# Patient Record
Sex: Female | Born: 1955 | Race: White | Hispanic: No | Marital: Single | Smoking: Never smoker
Health system: Southern US, Community
[De-identification: ages and names within clinical notes are randomized; demographics above are authoritative.]

## PROBLEM LIST (undated history)

## (undated) DIAGNOSIS — N189 Chronic kidney disease, unspecified: Secondary | ICD-10-CM

## (undated) DIAGNOSIS — S72002A Fracture of unspecified part of neck of left femur, initial encounter for closed fracture: Secondary | ICD-10-CM

## (undated) DIAGNOSIS — F3181 Bipolar II disorder: Secondary | ICD-10-CM

## (undated) DIAGNOSIS — F4310 Post-traumatic stress disorder, unspecified: Secondary | ICD-10-CM

## (undated) DIAGNOSIS — F329 Major depressive disorder, single episode, unspecified: Secondary | ICD-10-CM

## (undated) DIAGNOSIS — F32A Depression, unspecified: Secondary | ICD-10-CM

## (undated) DIAGNOSIS — E7800 Pure hypercholesterolemia, unspecified: Secondary | ICD-10-CM

## (undated) DIAGNOSIS — S7292XA Unspecified fracture of left femur, initial encounter for closed fracture: Secondary | ICD-10-CM

## (undated) DIAGNOSIS — S12100A Unspecified displaced fracture of second cervical vertebra, initial encounter for closed fracture: Secondary | ICD-10-CM

## (undated) DIAGNOSIS — I10 Essential (primary) hypertension: Secondary | ICD-10-CM

## (undated) HISTORY — PX: HIP SURGERY: SHX245

## (undated) HISTORY — PX: GASTRIC BYPASS: SHX52

---

## 2001-02-15 ENCOUNTER — Other Ambulatory Visit: Admission: RE | Admit: 2001-02-15 | Discharge: 2001-02-15 | Payer: Self-pay | Admitting: Family Medicine

## 2010-02-11 ENCOUNTER — Ambulatory Visit: Payer: Self-pay | Admitting: Internal Medicine

## 2010-03-05 ENCOUNTER — Ambulatory Visit: Payer: Self-pay | Admitting: Family Medicine

## 2012-02-05 ENCOUNTER — Inpatient Hospital Stay: Payer: Self-pay | Admitting: Orthopedic Surgery

## 2012-02-05 LAB — URINALYSIS, COMPLETE
Bilirubin,UR: NEGATIVE
Glucose,UR: NEGATIVE mg/dL (ref 0–75)
Nitrite: NEGATIVE
RBC,UR: 3 /HPF (ref 0–5)
Specific Gravity: 1.009 (ref 1.003–1.030)
Squamous Epithelial: NONE SEEN
WBC UR: 32 /HPF (ref 0–5)

## 2012-02-05 LAB — BASIC METABOLIC PANEL
Anion Gap: 7 (ref 7–16)
BUN: 21 mg/dL — ABNORMAL HIGH (ref 7–18)
Chloride: 108 mmol/L — ABNORMAL HIGH (ref 98–107)
EGFR (African American): 45 — ABNORMAL LOW
Glucose: 116 mg/dL — ABNORMAL HIGH (ref 65–99)
Osmolality: 280 (ref 275–301)
Potassium: 4.4 mmol/L (ref 3.5–5.1)

## 2012-02-05 LAB — CBC
HCT: 35.9 % (ref 35.0–47.0)
HGB: 11.8 g/dL — ABNORMAL LOW (ref 12.0–16.0)
MCH: 32.4 pg (ref 26.0–34.0)
MCHC: 32.7 g/dL (ref 32.0–36.0)
MCV: 99 fL (ref 80–100)
Platelet: 159 10*3/uL (ref 150–440)
RBC: 3.63 10*6/uL — ABNORMAL LOW (ref 3.80–5.20)
WBC: 8.9 10*3/uL (ref 3.6–11.0)

## 2012-02-07 LAB — BASIC METABOLIC PANEL
Anion Gap: 5 — ABNORMAL LOW (ref 7–16)
Calcium, Total: 7.9 mg/dL — ABNORMAL LOW (ref 8.5–10.1)
Chloride: 107 mmol/L (ref 98–107)
Co2: 25 mmol/L (ref 21–32)
Creatinine: 1.21 mg/dL (ref 0.60–1.30)
EGFR (African American): 58 — ABNORMAL LOW
EGFR (Non-African Amer.): 50 — ABNORMAL LOW
Glucose: 308 mg/dL — ABNORMAL HIGH (ref 65–99)
Potassium: 4.8 mmol/L (ref 3.5–5.1)
Sodium: 137 mmol/L (ref 136–145)

## 2012-02-08 LAB — HEMOGLOBIN: HGB: 10 g/dL — ABNORMAL LOW (ref 12.0–16.0)

## 2012-03-25 ENCOUNTER — Inpatient Hospital Stay: Payer: Self-pay | Admitting: Internal Medicine

## 2012-03-25 LAB — CBC
HCT: 33.9 % — ABNORMAL LOW (ref 35.0–47.0)
HGB: 11.5 g/dL — ABNORMAL LOW (ref 12.0–16.0)
MCH: 32.1 pg (ref 26.0–34.0)
MCV: 94 fL (ref 80–100)
Platelet: 178 10*3/uL (ref 150–440)
RBC: 3.6 10*6/uL — ABNORMAL LOW (ref 3.80–5.20)
RDW: 13.2 % (ref 11.5–14.5)
WBC: 7.1 10*3/uL (ref 3.6–11.0)

## 2012-03-25 LAB — COMPREHENSIVE METABOLIC PANEL
Anion Gap: 6 — ABNORMAL LOW (ref 7–16)
BUN: 23 mg/dL — ABNORMAL HIGH (ref 7–18)
Bilirubin,Total: 0.2 mg/dL (ref 0.2–1.0)
Chloride: 104 mmol/L (ref 98–107)
Co2: 27 mmol/L (ref 21–32)
EGFR (African American): 42 — ABNORMAL LOW
EGFR (Non-African Amer.): 36 — ABNORMAL LOW
Potassium: 4.7 mmol/L (ref 3.5–5.1)
SGOT(AST): 24 U/L (ref 15–37)
SGPT (ALT): 15 U/L (ref 12–78)
Sodium: 137 mmol/L (ref 136–145)
Total Protein: 7.2 g/dL (ref 6.4–8.2)

## 2012-03-25 LAB — TROPONIN I: Troponin-I: <0.02 ng/mL

## 2012-03-25 LAB — TSH: Thyroid Stimulating Horm: 2.52 u[IU]/mL

## 2012-03-25 LAB — PROTIME-INR: Prothrombin Time: 13.3 secs (ref 11.5–14.7)

## 2012-03-26 LAB — CBC WITH DIFFERENTIAL/PLATELET
Eosinophil %: 4.5 %
HCT: 32 % — ABNORMAL LOW (ref 35.0–47.0)
Lymphocyte #: 1.4 10*3/uL (ref 1.0–3.6)
MCV: 95 fL (ref 80–100)
Monocyte %: 8.2 %
Neutrophil #: 2.5 10*3/uL (ref 1.4–6.5)
Platelet: 137 10*3/uL — ABNORMAL LOW (ref 150–440)
RBC: 3.37 10*6/uL — ABNORMAL LOW (ref 3.80–5.20)
WBC: 4.5 10*3/uL (ref 3.6–11.0)

## 2012-03-26 LAB — BASIC METABOLIC PANEL
Anion Gap: 7 (ref 7–16)
Chloride: 115 mmol/L — ABNORMAL HIGH (ref 98–107)
Co2: 23 mmol/L (ref 21–32)
Creatinine: 0.94 mg/dL (ref 0.60–1.30)
Osmolality: 288 (ref 275–301)
Potassium: 4.1 mmol/L (ref 3.5–5.1)

## 2012-03-28 LAB — HEMOGLOBIN: HGB: 10.5 g/dL — ABNORMAL LOW (ref 12.0–16.0)

## 2012-04-30 ENCOUNTER — Ambulatory Visit: Payer: Self-pay | Admitting: Orthopedic Surgery

## 2013-01-24 ENCOUNTER — Ambulatory Visit: Payer: Self-pay | Admitting: Orthopedic Surgery

## 2013-01-24 LAB — BASIC METABOLIC PANEL
BUN: 27 mg/dL — ABNORMAL HIGH (ref 7–18)
Calcium, Total: 8.9 mg/dL (ref 8.5–10.1)
Chloride: 106 mmol/L (ref 98–107)
Co2: 24 mmol/L (ref 21–32)
EGFR (Non-African Amer.): 32 — ABNORMAL LOW
Glucose: 125 mg/dL — ABNORMAL HIGH (ref 65–99)
Osmolality: 282 (ref 275–301)
Sodium: 138 mmol/L (ref 136–145)

## 2013-01-24 LAB — HEMOGLOBIN: HGB: 11.6 g/dL — ABNORMAL LOW (ref 12.0–16.0)

## 2013-01-28 ENCOUNTER — Ambulatory Visit: Payer: Self-pay | Admitting: Orthopedic Surgery
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
## 2014-11-21 NOTE — Consult Note (Signed)
Brief Consult Note: Diagnosis: left femur fracture.   Patient was seen by consultant.   Orders entered.   Comments: hope we can treat nonoperatively, has new fracture around prior rod. will continue to follow.  Electronic Signatures: Laurene Footman (MD)  (Signed [phone] 07:19)  Authored: Brief Consult Note   Last Updated: 22-Aug-13 07:19 by Laurene Footman (MD)

## 2014-11-21 NOTE — Discharge Summary (Signed)
PATIENT NAME:  Kim Walton, Kim Walton MR#:  [id number] DATE OF BIRTH:  1956/05/15  DATE OF ADMISSION:  03/25/2012 DATE OF DISCHARGE:  03/30/2012  ADMITTING DIAGNOSES:  1. Femur fracture. 2. Hypotension. 3. Acute renal failure.   DISCHARGE DIAGNOSES:  1. Left femur fracture. 2. Recent left intertrochanteric hip fracture status post open reduction and internal fixation.  3. Hypotension.  4. Acute renal failure. 5. Dehydration, now resolved on intravenous fluids. 6. History of hypertension. 7. Diabetes mellitus. 8. Migraine headaches.  9. Posttraumatic stress disorder.  10. Questionable recurrent syncopal episodes.   DISCHARGE CONDITION: Fair.   DISCHARGE MEDICATIONS: The patient is to resume her outpatient medications which are: 1. Tylenol 500 mg caplets 2 tablets twice daily as needed.  2. Atenolol 25 mg p.o. daily.  3. Sertraline 100 mg p.o. in the evening.  4. Lamotrigine 100 mg p.o. daily.  5. Cyclobenzaprine 10 mg p.o. 3 times daily as needed. 6. Senokot-S 50/8.6 mg 1 tablet twice daily.  7. Topiramate 50 mg p.o. at bedtime.  8. Nucynta 50 mg, one to two tablets every six hours as needed.  9. Metformin 1 gram once daily.  10. Pramipexole 0.25 mg p.o. at bedtime.  11. Morphine, which is MS Contin, one tablet twice daily.  12. The patient is not to take clonidine.   DIET: 2 grams salt, low fat, low cholesterol, carbohydrate controlled diet, regular consistency.   ACTIVITY LIMITATIONS: The patient was advised to continue physical therapy 2 to 7 times a week.  FOLLOWUP:  1. Follow-up with her primary care physician, Dr. Army Melia, in two days after discharge.  2. The patient is to follow up with Dr. Rudene Christians in approximately one week after discharge. Dr. Rudene Christians recommended activity limitations to be toe touch weight bearing.   CONSULTANTS:  1. Dr. Rudene Christians. 2. Care management. 3. Dr. Melrose Nakayama, neurology.   RADIOLOGIC STUDIES: Left femur x-ray on 03/25/2012 showed new spirally oriented  fracture of the proximal and mid femoral diaphysis. The medial tibial plateau appears slightly irregular though this may be related to positioning. If there is concern for fracture at this site, dedicated knee radiographs would be recommended according to radiologist. Repeated x-ray femur left 03/27/2012 showed open reduction and internal fixation of left femoral fracture. Fracture is slightly displaced on today's exam compared to prior study.   HISTORY: The patient is a 59 year old Caucasian female who had fallen approximately six weeks ago prior to coming this time to the hospital and sustained left intertrochanteric hip fracture which was comminuted, treated with surgery at that time. Again, she was discharged to a skilled nursing facility but after one day staying there, she came back after fall with severe left lower extremity pains. She was brought to the Emergency Room and was hypotensive, requiring admission to Critical Care Unit. She was not certain about her fall. She thought that she fell backwards. X-rays in the Emergency Room revealed that new fracture is present around the stem of intramedullary rod with proximal as well as distal screws. There did not appear to be loss of fixation or rotational instability to the thigh. She had intact sensation of the foot and was able to flex as well as extend her toes. She was admitted to the hospital for pain control and Dr. Rudene Christians recommended to continue physical therapy, however, limit her activity to just toe-touch weight-bearing. He also recommended to follow-up x-rays in two days to make sure that the patient has no problem with prior implant because he was concerned that fracture  can extend to proximal distal screws, but it was not apparent on today's exam.   HOSPITAL COURSE: The patient was admitted to the hospital. She was treated for hypotension. It was felt that the patient had dehydration. In fact, her lab studies done in the Emergency Room revealed  elevated BUN and creatinine to 23 and 1.57. With IV fluid administration, the patient's pain and creatinine normalized. By 03/26/2012 it was 12 and 0.94.  1. In regards to the patient's fall and left femur fracture, the patient was continued on pain medications. In fact, she did quite well on extended-release morphine. She is to continue extended release morphine as well as Nucynta as needed. Her morphine should be discontinued as the patient tolerates. She is to follow up with Dr. Rudene Christians in the next 1 to 2 weeks after discharge.  2. For hypotension, the patient's blood pressure medications were placed on hold. The patient received IV fluids and her blood pressure normalized. Her Atenolol was renewed, however, clonidine was not yet restarted. However, the patient's blood pressure on the day of discharge is around 144. If the patient's blood pressure improves and increases, I believe that the patient's clonidine should be restarted as well.  3. For acute renal failure, as mentioned above, the patient was dehydrated and the patient's kidney function normalized when IV fluids were supplemented.  4. For diabetes mellitus, the patient is to continue metformin as well as 1,800 ADA diet.  5. For migraine headaches, she is to continue her outpatient medications.  6. The patient was complaining of some neurologic symptoms. Consultation with neurologist, Dr. Gurney Maxin, was obtained. Neurologist felt that the patient does not have any focal features of narcolepsy, so he reassured of this fact. She does not have severe sleepiness with frequent naps. She also does not have hypnagogic hallucinations, also sleep paralysis or cataplexy related to emotionality. As such, Dr. Melrose Nakayama felt that chances of having narcoplexy are very low. However, will screen for this possibility and evaluate for short lasting sample of complex partial seizures, he recommended a routine electroencephalogram. He felt that electroencephalogram  would evaluate for interictal activity. Also, the patient will be given chance to fall asleep to determine how fast she enters into REM if she does it at all. At this point he recommended he would continue to limit her pain as well as other psychoactive medications and provide reassurance against any neurologic cause of syncopal spells. He also recommended to consider vasovagal syncopal panic attacks/arrhythmias, also metabolic disturbances or medication effect. The patient was followed in the hospital on telemetry, especially the first few days on arrival to the hospital because of hypotension. However, she failed to show any kind of arrhythmias, so I doubt that the patient's course of those episodes when she falls or syncopizes are related to any arrhythmia or cardiac events at all. I would agreed that the patient needs a little bit more caution and I feel that the patient would need to follow up with her primary care physician and make decisions about further investigation for possible panic attacks, also, medication effect and I believe that maybe some of the patient's medications should be tapered down including pain medications, which are given for her pain at this time.   The patient is being discharged in stable condition with the above-mentioned medications and follow-up. Her vital signs on the day of discharge: Temperature 98.3, pulse 82, respiration rate 20, blood pressure 144/65, saturation 98% on room air at rest.  TIME SPENT: 40  minutes.   ____________________________ Theodoro Grist, MD rv:ap D: 03/30/2012 13:53:48 ET T: 03/30/2012 14:21:14 ET JOB#: [id number]  cc: Theodoro Grist, MD, <Dictator> Halina Maidens, MD Shambaugh MD ELECTRONICALLY SIGNED 04/14/2012 22:10
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
## 2014-11-24 NOTE — Op Note (Signed)
PATIENT NAME:  Kim Walton, Kim Walton MR#:  [id number] DATE OF BIRTH:  1955/11/07  DATE OF PROCEDURE:  01/28/2013  PREOPERATIVE DIAGNOSIS: Painful hardware, left hip and thigh.  POSTOPERATIVE DIAGNOSIS: Painful hardware, left hip and thigh.  PROCEDURE: Exchange of lag screw, left proximal femur, and removal of distal interlocking screw, left distal femur.   ANESTHESIA: General.  SURGEON: Laurene Footman, MD   DESCRIPTION OF PROCEDURE: The patient was brought to the operating room, and after adequate anesthesia was obtained, the patient was placed on the fracture table with the right leg in the well legholder, left foot in the traction boot, with no traction applied. After C-arm was brought in to make there was good visualization, the hip was prepped and draped in the usual sterile method. After patient identification and timeout procedures were completed, the proximal incisions were opened, going through the prior scar. The subcutaneous tissue was spread using a hemostat, and the top of the rod was fairly easily exposed. A screwdriver was then used to loosen the set screw to allow for removal of the lag screw. Through the lateral incision, a guidewire was inserted into the lag screw, and the screw was removed without difficulty. It as 110 mm, and so a screw 15 mm shorter was placed so it could be sunk entirely under the lateral aspect of the femoral cortex, and so the 95 mm screw was inserted and then the proximal set screw tightened to essentially make a fixed-angle device so the screw would not back out. At this point, the proximal hardware had been addressed. The wound was irrigated. The distal screw was then removed, going through the prior scar, and after the head of the screw was adequately exposed, the screw was removed. All wounds irrigated and closed with 2-0 Vicryl subcutaneously and skin staples. Xeroform, 4 x 4's and tape applied. The patient was sent to the recovery room in stable condition.    ESTIMATED BLOOD LOSS: Minimal.   COMPLICATIONS: None.   SPECIMENS: None. Hardware cleaned, to be given back to the patient.  ____________________________ Laurene Footman, MD [id number] D: 01/28/2013 17:44:54 ET T: 01/29/2013 05:48:44 ET JOB#: [id number]  cc: Laurene Footman, MD, <Dictator> Laurene Footman MD ELECTRONICALLY SIGNED 01/29/2013 7:27
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
## 2014-11-26 NOTE — Op Note (Signed)
PATIENT NAME:  Kim, Walton MR#:  [id number] DATE OF BIRTH:  08/31/1955  DATE OF PROCEDURE:  02/06/2012  PREOPERATIVE DIAGNOSIS: Left intertrochanteric hip fracture, comminuted.   POSTOPERATIVE DIAGNOSIS: Left intertrochanteric hip fracture, comminuted.   PROCEDURE: Open reduction internal fixation with intramedullary device.   SURGEON: Laurene Footman, MD   DESCRIPTION OF PROCEDURE:  The patient was brought to the operating room after adequate spinal anesthesia was obtained. The patient was placed on the fracture table with traction applied to the left hip through the traction boot, the right leg in the well legholder. C-arm was brought in and acceptable alignment could be visualized and acceptable reduction was obtained through closed means. The hip was prepped and draped using the barrier drape method. After patient identification and time-out procedures were completed, a proximal incision was made and a guidewire inserted adjacent to the tip of the greater trochanter and proximal reaming carried out. Guidewire was inserted and measurement of the rod obtained. Reaming was carried out with an 11-mm reamer and this was somewhat tight so the 9 x 360-mm left Affixus hip nail was then inserted without difficulty and was set to the appropriate level. After getting the rod to the appropriate level, a small lateral incision was made with the alignment targeting guide guidewire inserted across the fracture into the head in essentially a center, center position, measured and then drilling the lateral cortex.  A 110-mm lag screw was inserted. At this point traction was released compression applied across the fracture to get good fracture apposition. Next, the insertion handle was removed after locking the proximal lag screw. Next, going distally using a freehand technique, a single distal screw was placed in the slotted dynamic hole, first making a stab incision, drilling, measuring, and placing the 5 x 52-mm   Affixus cortical bone screw across the rod. At this point the bony surfaces were thoroughly irrigated and the proximal wound was closed with 2-0 Vicryl followed by skin staples. The other incisions were closed in a similar fashion. Xeroform, 4 x 4's, and ABD were applied. The patient was sent to the recovery room in stable condition.   ESTIMATED BLOOD LOSS: 200 mL.   COMPLICATIONS: None.   SPECIMEN: None.   IMPLANTS: Biomet Affixus hip fracture nail, left, 130 degrees, 9 x 360 mm with 110-mm lag screw and 52-mm distal bone interlocking screw.   ____________________________ Laurene Footman, MD mjm:bjt D: 02/06/2012 21:45:48 ET T: 02/07/2012 13:52:32 ET JOB#: [id number]  cc: Laurene Footman, MD, <Dictator> Laurene Footman MD ELECTRONICALLY SIGNED 02/07/2012 17:32

## 2014-11-26 NOTE — Consult Note (Signed)
Chief Complaint:   Subjective/Chief Complaint feels ok, waiting for surgery this afternoon, blood pressure stable   VITAL SIGNS/ANCILLARY NOTES: **Vital Signs.:   05-Jul-13 08:55   Vital Signs Type Q 4hr   Temperature Temperature (F) 98.7   Celsius 37   Temperature Source Oral   Pulse Pulse 103   Respirations Respirations 16   Systolic BP Systolic BP 99991111   Diastolic BP (mmHg) Diastolic BP (mmHg) 58   Mean BP 77   Pulse Ox % Pulse Ox % 96   Oxygen Delivery Room Air/ 21 %   Brief Assessment:   Cardiac Regular  no murmur    Respiratory normal resp effort  clear BS    Gastrointestinal Normal   Assessment/Plan:  Assessment/Plan:   Assessment 1. Relative hypotension with a history of hypertension: controlled now. 2. Diabetes: holdIng Glucophage for now and go with sliding scale. 3. acute renal failure: Hydrate and recheck. likely prerenal. 4. Posttraumatic stress disorder, traumatic brain injury and depression: Continue all psychiatric medications at this time.    Plan time spent: 15 mins   Electronic Signatures: Remer Macho (MD)  (Signed 05-Jul-13 13:09)  Authored: Chief Complaint, VITAL SIGNS/ANCILLARY NOTES, Brief Assessment, Assessment/Plan   Last Updated: 05-Jul-13 13:09 by Remer Macho (MD)
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
## 2015-01-07 ENCOUNTER — Emergency Department: Payer: Medicare Other

## 2015-01-07 ENCOUNTER — Emergency Department
Admission: EM | Admit: 2015-01-07 | Discharge: 2015-01-07 | Disposition: A | Discharge status: Home / Self Care | Payer: Medicare Other | Attending: Emergency Medicine | Admitting: Emergency Medicine

## 2015-01-07 DIAGNOSIS — M7918 Myalgia, other site: Secondary | ICD-10-CM

## 2015-01-07 DIAGNOSIS — Z79899 Other long term (current) drug therapy: Secondary | ICD-10-CM | POA: Diagnosis not present

## 2015-01-07 DIAGNOSIS — I129 Hypertensive chronic kidney disease with stage 1 through stage 4 chronic kidney disease, or unspecified chronic kidney disease: Secondary | ICD-10-CM | POA: Insufficient documentation

## 2015-01-07 DIAGNOSIS — M5441 Lumbago with sciatica, right side: Secondary | ICD-10-CM | POA: Insufficient documentation

## 2015-01-07 DIAGNOSIS — N189 Chronic kidney disease, unspecified: Secondary | ICD-10-CM | POA: Insufficient documentation

## 2015-01-07 DIAGNOSIS — M545 Low back pain: Secondary | ICD-10-CM | POA: Diagnosis present

## 2015-01-07 HISTORY — DX: Pure hypercholesterolemia, unspecified: E78.00

## 2015-01-07 HISTORY — DX: Fracture of unspecified part of neck of left femur, initial encounter for closed fracture: S72.002A

## 2015-01-07 HISTORY — DX: Chronic kidney disease, unspecified: N18.9

## 2015-01-07 HISTORY — DX: Post-traumatic stress disorder, unspecified: F43.10

## 2015-01-07 HISTORY — DX: Essential (primary) hypertension: I10

## 2015-01-07 HISTORY — DX: Unspecified displaced fracture of second cervical vertebra, initial encounter for closed fracture: S12.100A

## 2015-01-07 HISTORY — DX: Bipolar II disorder: F31.81

## 2015-01-07 HISTORY — DX: Major depressive disorder, single episode, unspecified: F32.9

## 2015-01-07 HISTORY — DX: Unspecified fracture of left femur, initial encounter for closed fracture: S72.92XA

## 2015-01-07 HISTORY — DX: Depression, unspecified: F32.A

## 2015-01-07 MED ORDER — MORPHINE SULFATE 4 MG/ML IJ SOLN
INTRAMUSCULAR | Status: AC
  Administered 2015-01-07: 4 mg via INTRAMUSCULAR
  Filled 2015-01-07: qty 1

## 2015-01-07 MED ORDER — DIAZEPAM 5 MG PO TABS
5.0000 mg | ORAL_TABLET | Freq: Three times a day (TID) | ORAL | Status: DC | PRN
Start: 2015-01-07 — End: 2015-01-07

## 2015-01-07 MED ORDER — ACETAMINOPHEN-CODEINE #3 300-30 MG PO TABS: 2.0000 | ORAL_TABLET | ORAL | Status: DC | PRN

## 2015-01-07 MED ORDER — MORPHINE SULFATE 4 MG/ML IJ SOLN
4.0000 mg | Freq: Once | INTRAMUSCULAR | Status: AC
  Administered 2015-01-07: 4 mg via INTRAMUSCULAR

## 2015-01-07 MED ORDER — DIAZEPAM 5 MG PO TABS
ORAL_TABLET | ORAL | Status: AC
  Administered 2015-01-07: 5 mg via ORAL
  Filled 2015-01-07: qty 1

## 2015-01-07 MED ORDER — KETOROLAC TROMETHAMINE 60 MG/2ML IM SOLN
60.0000 mg | Freq: Once | INTRAMUSCULAR | Status: AC
  Administered 2015-01-07: 60 mg via INTRAMUSCULAR

## 2015-01-07 MED ORDER — KETOROLAC TROMETHAMINE 60 MG/2ML IM SOLN
INTRAMUSCULAR | Status: AC
  Administered 2015-01-07: 60 mg via INTRAMUSCULAR
  Filled 2015-01-07: qty 2

## 2015-01-07 MED ORDER — TRAMADOL HCL 50 MG PO TABS: 50.0000 mg | ORAL_TABLET | Freq: Four times a day (QID) | ORAL | Status: DC | PRN

## 2015-01-07 MED ORDER — DIAZEPAM 5 MG PO TABS
5.0000 mg | ORAL_TABLET | Freq: Once | ORAL | Status: AC
  Administered 2015-01-07: 5 mg via ORAL

## 2015-01-07 NOTE — ED Notes (Signed)
Patient transported to X-ray 

## 2015-01-07 NOTE — ED Notes (Signed)
Patient transported to CT 

## 2015-01-07 NOTE — ED Notes (Signed)
Pt reports "straining" her lower back on Tues 5/31 while mopping the floor and feeling a "pull sensation". Pt reports progressive pain since then, pt has used TENS Unit, Back Brace, and Heating Packs in addition to taking Flexiril resulting in no relief.

## 2015-01-07 NOTE — Discharge Instructions (Signed)
Back Pain, Adult °Back pain is very common. The pain often gets better over time. The cause of back pain is usually not dangerous. Most people can learn to manage their back pain on their own.  °HOME CARE  °· Stay active. Start with short walks on flat ground if you can. Try to walk farther each day. °· Do not sit, drive, or stand in one place for more than 30 minutes. Do not stay in bed. °· Do not avoid exercise or work. Activity can help your back heal faster. °· Be careful when you bend or lift an object. Bend at your knees, keep the object close to you, and do not twist. °· Sleep on a firm mattress. Lie on your side, and bend your knees. If you lie on your back, put a pillow under your knees. °· Only take medicines as told by your doctor. °· Put ice on the injured area. °· Put ice in a plastic bag. °· Place a towel between your skin and the bag. °· Leave the ice on for 15-20 minutes, 03-04 times a day for the first 2 to 3 days. After that, you can switch between ice and heat packs. °· Ask your doctor about back exercises or massage. °· Avoid feeling anxious or stressed. Find good ways to deal with stress, such as exercise. °GET HELP RIGHT AWAY IF:  °· Your pain does not go away with rest or medicine. °· Your pain does not go away in 1 week. °· You have new problems. °· You do not feel well. °· The pain spreads into your legs. °· You cannot control when you poop (bowel movement) or pee (urinate). °· Your arms or legs feel weak or lose feeling (numbness). °· You feel sick to your stomach (nauseous) or throw up (vomit). °· You have belly (abdominal) pain. °· You feel like you may pass out (faint). °MAKE SURE YOU:  °· Understand these instructions. °· Will watch your condition. °· Will get help right away if you are not doing well or get worse. °Document Released: 01/07/2008 Document Revised: 10/13/2011 Document Reviewed: 11/22/2013 °Exit[employer]® Patient Information ©2015 [employer]. This information is not intended  to replace advice given to you by your health care provider. Make sure you discuss any questions you have with your health care provider. ° °Back Exercises °Back exercises help treat and prevent back injuries. The goal of back exercises is to increase the strength of your abdominal and back muscles and the flexibility of your back. These exercises should be started when you no longer have back pain. Back exercises include: °· Pelvic Tilt. Lie on your back with your knees bent. Tilt your pelvis until the lower part of your back is against the floor. Hold this position 5 to 10 sec and repeat 5 to 10 times. °· Knee to Chest. Pull first 1 knee up against your chest and hold for 20 to 30 seconds, repeat this with the other knee, and then both knees. This may be done with the other leg straight or bent, whichever feels better. °· Sit-Ups or Curl-Ups. Bend your knees 90 degrees. Start with tilting your pelvis, and do a partial, slow sit-up, lifting your trunk only 30 to 45 degrees off the floor. Take at least 2 to 3 seconds for each sit-up. Do not do sit-ups with your knees out straight. If partial sit-ups are difficult, simply do the above but with only tightening your abdominal muscles and holding it as directed. °· Hip-Lift.   Lie on your back with your knees flexed 90 degrees. Push down with your feet and shoulders as you raise your hips a couple inches off the floor; hold for 10 seconds, repeat 5 to 10 times. °· Back arches. Lie on your stomach, propping yourself up on bent elbows. Slowly press on your hands, causing an arch in your low back. Repeat 3 to 5 times. Any initial stiffness and discomfort should lessen with repetition over time. °· Shoulder-Lifts. Lie face down with arms beside your body. Keep hips and torso pressed to floor as you slowly lift your head and shoulders off the floor. °Do not overdo your exercises, especially in the beginning. Exercises may cause you some mild back discomfort which lasts for a  few minutes; however, if the pain is more severe, or lasts for more than 15 minutes, do not continue exercises until you see your caregiver. Improvement with exercise therapy for back problems is slow.  °See your caregivers for assistance with developing a proper back exercise program. °Document Released: 08/28/2004 Document Revised: 10/13/2011 Document Reviewed: 05/22/2011 °ExitCare® Patient Information ©2015 [employer]. This information is not intended to replace advice given to you by your health care provider. Make sure you discuss any questions you have with your health care provider. ° °

## 2015-01-07 NOTE — ED Provider Notes (Signed)
[facility] Emergency Department Provider Note  ____________________________________________  Time seen: Approximately [id number] AM  I have reviewed the triage vital signs and the nursing notes.   HISTORY  Chief Complaint Back Pain    HPI Kim Walton is a 59 y.o. female comes in with back pain. The patient reports that she was sweeping approximately 6 days ago and felt a mild twinge in her back. She reports that she stopped took some Tylenol and then continued sweeping and mopping. She reports that the pain became worse after she mopped. She reports that the next day when she woke up she had significant pain so she uses a TENS unit, rib belt and heating pad. The patient reports that the pain did improve but 2 days later it was worse and yesterday the pain was significantly worse. The patient reports that she planned to tough it out but could not handle the pain. She has been taking Tylenol and Benadryl for pain which has not been helping. She reports that she took a Flexeril last night which she commonly does not take and it did not help pain either. The pain is in her right side of her back goes down into her buttock as well as down into her leg. Patient denies any falls. She reports that she never called her doctor and thought that the pain would get better soon.Patient reports her pain as a 10 out of 10 in intensity.   Past Medical History  Diagnosis Date  . Chronic kidney disease   . C2 cervical fracture   . PTSD (post-traumatic stress disorder)   . Femur fracture, left   . Hip fracture, left   . Depression   . Bipolar 2 disorder, major depressive episode   . Hypertension   . High cholesterol     There are no active problems to display for this patient.   Past Surgical History  Procedure Laterality Date  . Gastric bypass      Current Outpatient Rx  Name  Route  Sig  Dispense  Refill  . diazepam (VALIUM) 5 MG tablet   Oral   Take 1 tablet (5 mg  total) by mouth every 8 (eight) hours as needed for anxiety.   12 tablet   0   . traMADol (ULTRAM) 50 MG tablet   Oral   Take 1 tablet (50 mg total) by mouth every 6 (six) hours as needed.   12 tablet   0     Allergies Betadine; Imitrex; Lidocaine; and Lotrimin  History reviewed. No pertinent family history.  Social History History  Substance Use Topics  . Smoking status: Never Smoker   . Smokeless tobacco: Never Used  . Alcohol Use: Yes    Review of Systems Constitutional: No fever/chills Eyes: No visual changes. ENT: No sore throat. Cardiovascular: Denies chest pain. Respiratory: Denies shortness of breath. Gastrointestinal: No abdominal pain.  No nausea, no vomiting.   Genitourinary: Negative for dysuria. Musculoskeletal: back pain with radiation down the right leg Skin: Negative for rash. Neurological: Negative for headaches, focal weakness or numbness.  10-point ROS otherwise negative.  ____________________________________________   PHYSICAL EXAM:  VITAL SIGNS: ED Triage Vitals  Enc Vitals Group     BP --      Pulse --      Resp --      Temp --      Temp src --      SpO2 --      Weight --  Height --      Head Cir --      Peak Flow --      Pain Score 01/07/15 0303 10     Pain Loc --      Pain Edu? --      Excl. in [place]? --     Constitutional: Alert and oriented. Well appearing and in moderate distress. Eyes: Conjunctivae are normal. PERRL. EOMI. Head: Atraumatic. Nose: No congestion/rhinnorhea. Mouth/Throat: Mucous membranes are moist.  Oropharynx non-erythematous. Cardiovascular: Normal rate, regular rhythm. Grossly normal heart sounds.  Good peripheral circulation. Respiratory: Normal respiratory effort.  No retractions. Lungs CTAB. Gastrointestinal: Soft and nontender. No distention. Positive bowel sounds Genitourinary: deferred Musculoskeletal: right sided back pain with positive straight leg raise Neurologic:  Normal speech and  language. No gross focal neurologic deficits are appreciated.  Skin:  Skin is warm, dry and intact. No rash noted. Psychiatric: Mood and affect are normal.   ____________________________________________   LABS (all labs ordered are listed, but only abnormal results are displayed)  Labs Reviewed - No data to display ____________________________________________  EKG  none ____________________________________________  RADIOLOGY  Lumbosacral xray: pending ____________________________________________   PROCEDURES  Procedure(s) performed: None  Critical Care performed: No  ____________________________________________   INITIAL IMPRESSION / ASSESSMENT AND PLAN / ED COURSE  Pertinent labs & imaging results that were available during my care of the patient were reviewed by me and considered in my medical decision making (see chart for details).  This is a 59-year-old female who comes in with back pain for the past 6 days. Has not been improving. I will give the patient some Toradol and Valium and morphine and reassess the patient's pain.  The patient's pain has improved significantly after the pain medication. The patient will receive an X ray to determine if she has any occult fracture and then her disposition will be determined by Dr. Corky Downs. ____________________________________________   FINAL CLINICAL IMPRESSION(S) / ED DIAGNOSES  Final diagnoses:  Right-sided low back pain with right-sided sciatica  Musculoskeletal pain      Loney Hering, MD 01/07/15 [phone]

## 2015-01-10 ENCOUNTER — Inpatient Hospital Stay
Admission: EM | Admit: 2015-01-10 | Discharge: 2015-01-15 | DRG: 689 | Disposition: A | Discharge status: Skilled Nursing Facility | Payer: Medicare Other | Attending: Internal Medicine | Admitting: Internal Medicine

## 2015-01-10 ENCOUNTER — Emergency Department: Payer: Medicare Other

## 2015-01-10 DIAGNOSIS — Z9884 Bariatric surgery status: Secondary | ICD-10-CM

## 2015-01-10 DIAGNOSIS — E1121 Type 2 diabetes mellitus with diabetic nephropathy: Secondary | ICD-10-CM | POA: Diagnosis present

## 2015-01-10 DIAGNOSIS — E780 Pure hypercholesterolemia: Secondary | ICD-10-CM | POA: Diagnosis present

## 2015-01-10 DIAGNOSIS — I129 Hypertensive chronic kidney disease with stage 1 through stage 4 chronic kidney disease, or unspecified chronic kidney disease: Secondary | ICD-10-CM | POA: Diagnosis present

## 2015-01-10 DIAGNOSIS — R4701 Aphasia: Secondary | ICD-10-CM | POA: Diagnosis present

## 2015-01-10 DIAGNOSIS — N390 Urinary tract infection, site not specified: Secondary | ICD-10-CM | POA: Diagnosis not present

## 2015-01-10 DIAGNOSIS — M545 Low back pain: Secondary | ICD-10-CM | POA: Diagnosis present

## 2015-01-10 DIAGNOSIS — Z886 Allergy status to analgesic agent status: Secondary | ICD-10-CM

## 2015-01-10 DIAGNOSIS — R410 Disorientation, unspecified: Secondary | ICD-10-CM | POA: Diagnosis not present

## 2015-01-10 DIAGNOSIS — Z888 Allergy status to other drugs, medicaments and biological substances status: Secondary | ICD-10-CM

## 2015-01-10 DIAGNOSIS — F3181 Bipolar II disorder: Secondary | ICD-10-CM | POA: Diagnosis present

## 2015-01-10 DIAGNOSIS — Z87820 Personal history of traumatic brain injury: Secondary | ICD-10-CM

## 2015-01-10 DIAGNOSIS — N3000 Acute cystitis without hematuria: Secondary | ICD-10-CM | POA: Diagnosis present

## 2015-01-10 DIAGNOSIS — W19XXXA Unspecified fall, initial encounter: Secondary | ICD-10-CM

## 2015-01-10 DIAGNOSIS — N183 Chronic kidney disease, stage 3 (moderate): Secondary | ICD-10-CM | POA: Diagnosis present

## 2015-01-10 DIAGNOSIS — G9341 Metabolic encephalopathy: Secondary | ICD-10-CM | POA: Diagnosis present

## 2015-01-10 DIAGNOSIS — M25551 Pain in right hip: Secondary | ICD-10-CM | POA: Diagnosis present

## 2015-01-10 DIAGNOSIS — Z79899 Other long term (current) drug therapy: Secondary | ICD-10-CM

## 2015-01-10 DIAGNOSIS — R4182 Altered mental status, unspecified: Secondary | ICD-10-CM | POA: Diagnosis present

## 2015-01-10 DIAGNOSIS — N179 Acute kidney failure, unspecified: Secondary | ICD-10-CM | POA: Diagnosis present

## 2015-01-10 DIAGNOSIS — F4310 Post-traumatic stress disorder, unspecified: Secondary | ICD-10-CM | POA: Diagnosis present

## 2015-01-10 LAB — CBC WITH DIFFERENTIAL/PLATELET
BASOS PCT: 0 %
Basophils Absolute: 0 10*3/uL (ref 0–0.1)
EOS ABS: 0.1 10*3/uL (ref 0–0.7)
Eosinophils Relative: 1 %
HCT: 29.1 % — ABNORMAL LOW (ref 35.0–47.0)
HEMOGLOBIN: 9.6 g/dL — AB (ref 12.0–16.0)
Lymphocytes Relative: 9 %
Lymphs Abs: 1 10*3/uL (ref 1.0–3.6)
MCH: 34 pg (ref 26.0–34.0)
MCHC: 33.1 g/dL (ref 32.0–36.0)
MCV: 102.9 fL — ABNORMAL HIGH (ref 80.0–100.0)
MONO ABS: 0.8 10*3/uL (ref 0.2–0.9)
MONOS PCT: 7 %
NEUTROS ABS: 9.3 10*3/uL — AB (ref 1.4–6.5)
NEUTROS PCT: 83 %
PLATELETS: 375 10*3/uL (ref 150–440)
RBC: 2.83 MIL/uL — ABNORMAL LOW (ref 3.80–5.20)
RDW: 14.5 % (ref 11.5–14.5)
WBC: 11.2 10*3/uL — AB (ref 3.6–11.0)

## 2015-01-10 LAB — COMPREHENSIVE METABOLIC PANEL
ALBUMIN: 3.1 g/dL — AB (ref 3.5–5.0)
ALT: 19 U/L (ref 14–54)
AST: 56 U/L — ABNORMAL HIGH (ref 15–41)
Alkaline Phosphatase: 101 U/L (ref 38–126)
Anion gap: 11 (ref 5–15)
BUN: 44 mg/dL — ABNORMAL HIGH (ref 6–20)
CHLORIDE: 106 mmol/L (ref 101–111)
CO2: 18 mmol/L — ABNORMAL LOW (ref 22–32)
CREATININE: 1.76 mg/dL — AB (ref 0.44–1.00)
Calcium: 9.2 mg/dL (ref 8.9–10.3)
GFR, EST AFRICAN AMERICAN: 35 mL/min — AB (ref >60)
GFR, EST NON AFRICAN AMERICAN: 30 mL/min — AB (ref >60)
GLUCOSE: 193 mg/dL — AB (ref 65–99)
Potassium: 4.2 mmol/L (ref 3.5–5.1)
Sodium: 135 mmol/L (ref 135–145)
Total Bilirubin: 0.6 mg/dL (ref 0.3–1.2)
Total Protein: 7.8 g/dL (ref 6.5–8.1)

## 2015-01-10 LAB — URINALYSIS COMPLETE WITH MICROSCOPIC (ARMC ONLY)
BILIRUBIN URINE: NEGATIVE
GLUCOSE, UA: NEGATIVE mg/dL
Ketones, ur: NEGATIVE mg/dL
NITRITE: NEGATIVE
Protein, ur: 100 mg/dL — AB
Specific Gravity, Urine: 1.012 (ref 1.005–1.030)
pH: 8 (ref 5.0–8.0)

## 2015-01-10 LAB — URINE DRUG SCREEN, QUALITATIVE (ARMC ONLY)
Amphetamines, Ur Screen: NOT DETECTED
BARBITURATES, UR SCREEN: NOT DETECTED
Benzodiazepine, Ur Scrn: POSITIVE — AB
Cannabinoid 50 Ng, Ur ~~LOC~~: NOT DETECTED
Cocaine Metabolite,Ur ~~LOC~~: NOT DETECTED
MDMA (Ecstasy)Ur Screen: NOT DETECTED
METHADONE SCREEN, URINE: NOT DETECTED
Opiate, Ur Screen: POSITIVE — AB
PHENCYCLIDINE (PCP) UR S: NOT DETECTED
Tricyclic, Ur Screen: POSITIVE — AB

## 2015-01-10 LAB — GLUCOSE, CAPILLARY: Glucose-Capillary: 179 mg/dL — ABNORMAL HIGH (ref 65–99)

## 2015-01-10 LAB — AMMONIA: Ammonia: 10 umol/L (ref 9–35)

## 2015-01-10 LAB — TROPONIN I: Troponin I: <0.03 ng/mL (ref <0.031)

## 2015-01-10 LAB — LIPASE, BLOOD: Lipase: 54 U/L — ABNORMAL HIGH (ref 22–51)

## 2015-01-10 LAB — ACETAMINOPHEN LEVEL: Acetaminophen (Tylenol), Serum: <10 ug/mL — ABNORMAL LOW (ref 10–30)

## 2015-01-10 LAB — SALICYLATE LEVEL: Salicylate Lvl: <4 mg/dL (ref 2.8–30.0)

## 2015-01-10 MED ORDER — CEFTRIAXONE SODIUM IN DEXTROSE 20 MG/ML IV SOLN
INTRAVENOUS | Status: AC
  Administered 2015-01-10: 1 g via INTRAVENOUS
  Filled 2015-01-10: qty 50

## 2015-01-10 MED ORDER — SERTRALINE HCL 50 MG PO TABS
100.0000 mg | ORAL_TABLET | Freq: Every day | ORAL | Status: DC
  Administered 2015-01-11 – 2015-01-15 (×5): 100 mg via ORAL
  Filled 2015-01-10 (×5): qty 2

## 2015-01-10 MED ORDER — LAMOTRIGINE 25 MG PO TABS
100.0000 mg | ORAL_TABLET | Freq: Two times a day (BID) | ORAL | Status: DC
  Administered 2015-01-11 – 2015-01-15 (×9): 100 mg via ORAL
  Filled 2015-01-10 (×9): qty 4

## 2015-01-10 MED ORDER — SODIUM CHLORIDE 0.9 % IV BOLUS (SEPSIS)
1000.0000 mL | Freq: Once | INTRAVENOUS | Status: AC
  Administered 2015-01-10: 1000 mL via INTRAVENOUS

## 2015-01-10 MED ORDER — CEFTRIAXONE SODIUM IN DEXTROSE 20 MG/ML IV SOLN
1.0000 g | Freq: Once | INTRAVENOUS | Status: AC
  Administered 2015-01-10: 1 g via INTRAVENOUS

## 2015-01-10 MED ORDER — SODIUM BICARBONATE 650 MG PO TABS
650.0000 mg | ORAL_TABLET | Freq: Two times a day (BID) | ORAL | Status: DC
  Administered 2015-01-11 – 2015-01-15 (×9): 650 mg via ORAL
  Filled 2015-01-10 (×11): qty 1

## 2015-01-10 MED ORDER — TOPIRAMATE 25 MG PO TABS
50.0000 mg | ORAL_TABLET | Freq: Two times a day (BID) | ORAL | Status: DC
  Administered 2015-01-11 – 2015-01-15 (×9): 50 mg via ORAL
  Filled 2015-01-10 (×10): qty 2

## 2015-01-10 MED ORDER — CEFTRIAXONE SODIUM IN DEXTROSE 20 MG/ML IV SOLN
1.0000 g | INTRAVENOUS | Status: DC
  Administered 2015-01-12 – 2015-01-15 (×4): 1 g via INTRAVENOUS
  Filled 2015-01-10 (×5): qty 50

## 2015-01-10 MED ORDER — FERROUS SULFATE 325 (65 FE) MG PO TABS
325.0000 mg | ORAL_TABLET | Freq: Every day | ORAL | Status: DC
  Administered 2015-01-11 – 2015-01-15 (×5): 325 mg via ORAL
  Filled 2015-01-10 (×5): qty 1

## 2015-01-10 MED ORDER — VITAMIN C 500 MG PO TABS
500.0000 mg | ORAL_TABLET | Freq: Every day | ORAL | Status: DC
  Administered 2015-01-11 – 2015-01-15 (×5): 500 mg via ORAL
  Filled 2015-01-10 (×5): qty 1

## 2015-01-10 MED ORDER — SIMVASTATIN 20 MG PO TABS
20.0000 mg | ORAL_TABLET | Freq: Every day | ORAL | Status: DC
  Administered 2015-01-11 – 2015-01-14 (×4): 20 mg via ORAL
  Filled 2015-01-10 (×4): qty 1

## 2015-01-10 MED ORDER — CALCIUM CARBONATE-VITAMIN D 500-200 MG-UNIT PO TABS
1.0000 | ORAL_TABLET | Freq: Two times a day (BID) | ORAL | Status: DC
  Administered 2015-01-11 – 2015-01-15 (×9): 1 via ORAL
  Filled 2015-01-10 (×9): qty 1

## 2015-01-10 MED ORDER — ASPIRIN 81 MG PO CHEW
81.0000 mg | CHEWABLE_TABLET | Freq: Every day | ORAL | Status: DC
  Administered 2015-01-11 – 2015-01-15 (×5): 81 mg via ORAL
  Filled 2015-01-10 (×5): qty 1

## 2015-01-10 NOTE — ED Notes (Signed)
Mallie Mussel, RN called the lab to add a urine culture to the specimen at the lab.

## 2015-01-10 NOTE — ED Notes (Signed)
Patient to ER from home for c/o AMS. Patient was found by cousin approx 62 mins PTA with strange behavior and patient had been incontinent of urine. Patient has no history of incontinence or seizures. Patient was recently seen at [facility] and diagnosed with sciatica. Patient was prescribed Tylenol #3. Had bottle filled on Monday with 30 pills, 10 pills left in bottle currently. Per EMS, prescription was for q2 hours prn. Patient was seen by cousin yesterday at 39 with sluggish behavior but otherwise normal. Patient unable to follow commands at all times upon arrival, tremors present.

## 2015-01-10 NOTE — ED Notes (Signed)
Pt returned from CT and xray. 

## 2015-01-10 NOTE — ED Notes (Signed)
Admitting MD at bedside talking to family.

## 2015-01-10 NOTE — ED Provider Notes (Signed)
[facility] Emergency Department Provider Note  ____________________________________________  Time seen: Approximately 6:39 PM  I have reviewed the triage vital signs and the nursing notes.   HISTORY  Chief Complaint Altered Mental Status    HPI Kim Walton is a 59 y.o. femalewho was found confused and incontinent by her sister today. Evidently, sister, who is not present at this time, saw her and she was acting strange last night as well. But today the patient is very confused.   EM caveat. Patient unable to provide history. History and exam are limited by patient's altered mental status and confusion.   Past Medical History  Diagnosis Date  . Chronic kidney disease   . C2 cervical fracture   . PTSD (post-traumatic stress disorder)   . Femur fracture, left   . Hip fracture, left   . Depression   . Bipolar 2 disorder, major depressive episode   . Hypertension   . High cholesterol     There are no active problems to display for this patient.   Past Surgical History  Procedure Laterality Date  . Gastric bypass      Current Outpatient Rx  Name  Route  Sig  Dispense  Refill  . acetaminophen-codeine (TYLENOL #3) 300-30 MG per tablet   Oral   Take 2 tablets by mouth every 4 (four) hours as needed for moderate pain.   30 tablet   0     Allergies Betadine; Imitrex; Lidocaine; and Lotrimin  No family history on file.  Social History History  Substance Use Topics  . Smoking status: Never Smoker   . Smokeless tobacco: Never Used  . Alcohol Use: Yes    Review of Systems EM caveat. Confused. Unable to provide.  ____________________________________________   PHYSICAL EXAM:  VITAL SIGNS: ED Triage Vitals  Enc Vitals Group     BP 01/10/15 1832 119/90 mmHg     Pulse --      Resp 01/10/15 1832 14     Temp --      Temp src --      SpO2 01/10/15 1828 98 %     Weight 01/10/15 1832 167 lb (75.751 kg)     Height 01/10/15 1832 5'  4" (1.626 m)     Head Cir --      Peak Flow --      Pain Score 01/10/15 1835 3     Pain Loc --      Pain Edu? --      Excl. in [place]? --     patient is somnolent, occasionally alerts, she is acting confused or delirious Eyes show intact extraocular movements normal conjunctiva pupils are midline and reactive, Head atraumatic Dry mucous membranes in the mouth, Regular rate and rhythm, no rub or  Cardiovascular: Normal rate, regular rhythm. Grossly normal heart sounds.  Good peripheral circulation. Respiratory: Normal respiratory effort.  No retractions. Lungs CTAB. Gastrointestinal: Soft and nontender. No distention. No abdominal bruits. No CVA tenderness. Musculoskeletal: No lower extremity tenderness nor edema.  No joint effusions. NeurologicIs somnolent, equal smile, equal but weak use of all extremities, will follow basic one step commands such as wiggly or toes, closure eyes, but she is unable to provide any sort of history Skin:  Skin is warm, dry and intact. No rash noted.   ____________________________________________   LABS (all labs ordered are listed, but only abnormal results are displayed)  Labs Reviewed  ACETAMINOPHEN LEVEL - Abnormal; Notable for the following:  Acetaminophen (Tylenol), Serum <10 (*)    All other components within normal limits  CBC WITH DIFFERENTIAL/PLATELET - Abnormal; Notable for the following:    WBC 11.2 (*)    RBC 2.83 (*)    Hemoglobin 9.6 (*)    HCT 29.1 (*)    MCV 102.9 (*)    Neutro Abs 9.3 (*)    All other components within normal limits  COMPREHENSIVE METABOLIC PANEL - Abnormal; Notable for the following:    CO2 18 (*)    Glucose, Bld 193 (*)    BUN 44 (*)    Creatinine, Ser 1.76 (*)    Albumin 3.1 (*)    AST 56 (*)    GFR calc non Af Amer 30 (*)    GFR calc Af Amer 35 (*)    All other components within normal limits  LIPASE, BLOOD - Abnormal; Notable for the following:    Lipase 54 (*)    All other components within normal  limits  URINE DRUG SCREEN, QUALITATIVE ([facility] ONLY) - Abnormal; Notable for the following:    Tricyclic, Ur Screen POSITIVE (*)    Opiate, Ur Screen POSITIVE (*)    Benzodiazepine, Ur Scrn POSITIVE (*)    All other components within normal limits  URINALYSIS COMPLETEWITH MICROSCOPIC ([facility] ONLY) - Abnormal; Notable for the following:    Color, Urine YELLOW (*)    APPearance CLOUDY (*)    Hgb urine dipstick 3+ (*)    Protein, ur 100 (*)    Leukocytes, UA 3+ (*)    Bacteria, UA MANY (*)    Squamous Epithelial / LPF 0-5 (*)    All other components within normal limits  GLUCOSE, CAPILLARY - Abnormal; Notable for the following:    Glucose-Capillary 179 (*)    All other components within normal limits  URINE CULTURE  TROPONIN I  SALICYLATE LEVEL  AMMONIA  CBG MONITORING, ED   ____________________________________________  EKG  Reviewed and interpreted by me Normal sinus rhythm, artifact challenges interpretation some but there is no acute ST abnormality noted. Criteria for LVH are present. Minimal QT prolongation PR 178, QRS 112, There is no obvious acute ischemic change ____________________________________________  RADIOLOGY  CT Head Wo Contrast (Final result) Result time: 01/10/15 20:05:15   Final result by Rad Results In Interface (01/10/15 20:05:15)   Narrative:   CLINICAL DATA: Altered mental status. Unusual behavior. Urinary incontinence.  EXAM: CT HEAD WITHOUT CONTRAST  TECHNIQUE: Contiguous axial images were obtained from the base of the skull through the vertex without intravenous contrast.  COMPARISON: None.  FINDINGS: No evidence for acute infarction, hemorrhage, mass lesion, hydrocephalus, or extra-axial fluid. Normal for age cerebral volume. Hypoattenuation of white matter, likely chronic microvascular ischemic change. Calvarium intact. No sinus or mastoid air fluid level. Negative orbits.  IMPRESSION: No acute intracranial  findings.   Electronically Signed By: Rolla Flatten M.D. On: 01/10/2015 20:05          DG Chest Port 1 View (Final result) Result time: 01/10/15 19:48:09   Final result by Rad Results In Interface (01/10/15 19:48:09)   Narrative:   CLINICAL DATA: Altered mental status. Incontinence.  EXAM: PORTABLE CHEST - 1 VIEW  COMPARISON: None  FINDINGS: Normal mediastinum and cardiac silhouette. Normal pulmonary vasculature. No evidence of effusion, infiltrate, or pneumothorax. The right humerus is riding high in the glenoid fossa suggesting superior subluxation.  IMPRESSION: 1. No acute cardiopulmonary findings. 2. The right humerus appears subluxed. This could indicate chronic rotator cuff injury. Recommend  clinical correlation for acute injury. aa    ____________________________________________   PROCEDURES  Procedure(s) performed: None  Critical Care performed: No  ____________________________________________   INITIAL IMPRESSION / ASSESSMENT AND PLAN / ED COURSE  Pertinent labs & imaging results that were available during my care of the patient were reviewed by me and considered in my medical decision making (see chart for details).  Patient presents with acute alteration of mental status. She is having intermittent changes in her level of alertness, occasionally open her eyes and able to answer only very simple questions, but then will become somnolent. In addition, the patient is not oriented except to self. She is on will provide any history. She does follow basic commands and she is able to move her extremities, there is no obvious focal deficit. She was last seen last night and was confused at that time. Of note the patient does have prescriptions for tramadol, Valium, and recently codeine. Is quite possible that this could represent polypharmacy. There is no report of suicidality or overdose. Differential diagnosis remains broad though, infectious, toxic,  metabolic, stroke, cardiac, etc. are all considered at this time. We'll monitor the patient closely and obtain a battery of laboratory and radiologic studies to further evaluate cause for altered mental status. ____________________________________________  ----------------------------------------- 8:15 PM on 01/10/2015 -----------------------------------------  Since mental status is slowly improving. She is now at least oriented to person and place. I reevaluated, she does state she is having ongoing back pain which she has been having for quite some time now and appears to be chronic in nature. In addition, patient states she has a distant history of injury to the right shoulder. She denies any new pain or concerns in the right shoulder. She denies headache. She is still quite somnolent. Her urinalysis indicates urinary tract infection. We'll initiate antibiotic's admitted to the hospital due to alteration in mental status.  FINAL CLINICAL IMPRESSION(S) / ED DIAGNOSES  Final diagnoses:  Polypharmacy  Delirium, acute  Urinary tract infection, acute   Case discussed with Dr. Posey Pronto. We'll admit to the hospitalist service.   Delman Kitten, MD 01/10/15 2029

## 2015-01-10 NOTE — Progress Notes (Deleted)
[facility] [place] NAME: Kim Walton    MR#:  [id number]  DATE OF BIRTH:  [birth year]  DATE OF ADMISSION:  01/10/2015  PRIMARY CARE PHYSICIAN: Dr Fayette Pho in [place]  REQUESTING/REFERRING PHYSICIAN: Dr Jacqualine Code  CHIEF COMPLAINT:  Altered mental status  HISTORY OF PRESENT ILLNESS:  Kim Walton  is a 59 y.o. female with a known history of DM-2 (not on meds),bipolar disorder hypertension hyperlipidemia depression, PTSD, chronic kidney disease status post kidney biopsy a few months ago came to the emergency room brought in by patient's cousin after she was found to have altered mental status, significant weakness and increasing fatigability.  Patient in the emergency room was found to be tachycardic with cloudy urine suggestive of UTI. Patient recently was seen in the emergency room on June 5 with low back pain and was given Flexeril and Tylenol No. 3. She was prescribed total 30 pills and 19 pills remaining. There was a question of polypharmacy and possible UTI. Patient received dose of IV Rocephin she is being admitted for further hypertension management.  PAST MEDICAL HISTORY:   Past Medical History  Diagnosis Date  . Chronic kidney disease   . C2 cervical fracture   . PTSD (post-traumatic stress disorder)   . Femur fracture, left   . Hip fracture, left   . Depression   . Bipolar 2 disorder, major depressive episode   . Hypertension   . High cholesterol     PAST SURGICAL HISTOIRY:   Past Surgical History  Procedure Laterality Date  . Gastric bypass      SOCIAL HISTORY:   History  Substance Use Topics  . Smoking status: Never Smoker   . Smokeless tobacco: Never Used  . Alcohol Use: Yes    FAMILY HISTORY:  No family history on file.  DRUG ALLERGIES:   Allergies  Allergen Reactions  . Betadine [Povidone Iodine] Other (See Comments)    Reaction:  Unknown   . Imitrex [Sumatriptan] Other (See Comments)   Reaction:  Cold sweats   . Lidocaine Hives  . Lotrimin [Clotrimazole] Nausea And Vomiting    REVIEW OF SYSTEMS:  Review of Systems  Constitutional: Positive for malaise/fatigue. Negative for fever, chills and diaphoresis.  HENT: Negative for congestion, ear pain, hearing loss, nosebleeds and sore throat.   Eyes: Negative for blurred vision, double vision, photophobia and pain.  Respiratory: Negative for cough, sputum production, wheezing and stridor.   Cardiovascular: Negative for orthopnea, claudication and leg swelling.  Gastrointestinal: Negative for heartburn and abdominal pain.  Genitourinary: Positive for dysuria. Negative for frequency.  Musculoskeletal: Positive for back pain. Negative for joint pain and neck pain.  Skin: Negative for rash.  Neurological: Positive for weakness. Negative for tingling, sensory change, speech change, focal weakness, seizures and headaches.  Endo/Heme/Allergies: Does not bruise/bleed easily.  Psychiatric/Behavioral: Negative for memory loss. The patient is nervous/anxious.      MEDICATIONS AT HOME:   Prior to Admission medications   Medication Sig Start Date End Date Taking? Authorizing Provider  acetaminophen-codeine (TYLENOL #3) 300-30 MG per tablet Take 2 tablets by mouth every 4 (four) hours as needed for moderate pain. 01/07/15   Lavonia Drafts, MD      VITAL SIGNS:  Blood pressure 118/97, pulse 86, temperature 97.7 F (36.5 C), temperature source Oral, resp. rate 17, height 5\' 4"  (1.626 m), weight 75.751 kg (167 lb), SpO2 98 %.  PHYSICAL EXAMINATION:  GENERAL:  59 y.o.-year-old patient lying  in the bed with no acute distress. Some confusion EYES: Pupils equal, round, reactive to light and accommodation. No scleral icterus. Extraocular muscles intact.  HEENT: Head atraumatic, normocephalic. Oropharynx and nasopharynx clear.  NECK:  Supple, no jugular venous distention. No thyroid enlargement, no tenderness.  LUNGS: Normal breath sounds  bilaterally, no wheezing, rales,rhonchi or crepitation. No use of accessory muscles of respiration.  CARDIOVASCULAR: S1, S2 normal. No murmurs, rubs, or gallops.  ABDOMEN: Soft, nontender, nondistended. Bowel sounds present. No organomegaly or mass.  EXTREMITIES: No pedal edema, cyanosis, or clubbing.  NEUROLOGIC: Cranial nerves II through XII are intact. Muscle strength 5/5 in all extremities. Sensation intact. Gait not checked.  PSYCHIATRIC: The patient is alert and oriented x2 anxious SKIN: No obvious rash, lesion, or ulcer.   LABORATORY PANEL:   CBC  Recent Labs Lab 01/10/15 1857  WBC 11.2*  HGB 9.6*  HCT 29.1*  PLT 375   ------------------------------------------------------------------------------------------------------------------  Chemistries   Recent Labs Lab 01/10/15 1857  NA 135  K 4.2  CL 106  CO2 18*  GLUCOSE 193*  BUN 44*  CREATININE 1.76*  CALCIUM 9.2  AST 56*  ALT 19  ALKPHOS 101  BILITOT 0.6   ------------------------------------------------------------------------------------------------------------------  Cardiac Enzymes  Recent Labs Lab 01/10/15 1857  TROPONINI <0.03   ------------------------------------------------------------------------------------------------------------------  RADIOLOGY:  Ct Head Wo Contrast  01/10/2015   CLINICAL DATA:  Altered mental status. Unusual behavior. Urinary incontinence.  EXAM: CT HEAD WITHOUT CONTRAST  TECHNIQUE: Contiguous axial images were obtained from the base of the skull through the vertex without intravenous contrast.  COMPARISON:  None.  FINDINGS: No evidence for acute infarction, hemorrhage, mass lesion, hydrocephalus, or extra-axial fluid. Normal for age cerebral volume. Hypoattenuation of white matter, likely chronic microvascular ischemic change. Calvarium intact. No sinus or mastoid air fluid level. Negative orbits.  IMPRESSION: No acute intracranial findings.   Electronically Signed   By: Rolla Flatten M.D.   On: 01/10/2015 20:05   Dg Chest Port 1 View  01/10/2015   CLINICAL DATA:  Altered mental status.  Incontinence.  EXAM: PORTABLE CHEST - 1 VIEW  COMPARISON:  None  FINDINGS: Normal mediastinum and cardiac silhouette. Normal pulmonary vasculature. No evidence of effusion, infiltrate, or pneumothorax. The right humerus is riding high in the glenoid fossa suggesting superior subluxation.  IMPRESSION: 1. No acute cardiopulmonary findings. 2. The right humerus appears subluxed. This could indicate chronic rotator cuff injury. Recommend clinical correlation for acute injury. aa   Electronically Signed   By: Suzy Bouchard M.D.   On: 01/10/2015 19:48    EKG:   NSR,IncompleteLBBB IMPRESSION AND PLAN:   59 year old Mrs. Dotson with history of hypertension type 2 diabetes not on any medication history of bipolar disorder PTSD depression comes to the emergency room and found confused by her cousin. Patient was brought to the emergency room she is somewhat more awake alert and is recognizing people. She is quite anxious. She was found to have #1 altered mental status/acute encephalopathy suspected due to UTI and for all polypharmacy. We'll admit patient to medical floor. IV fluids for hydration. Follow blood culture urine culture. IV Rocephin.  #2 acute on chronic renal failure. Patient has history of C Katy stage III. Baseline creatinine around 1.12. She follows up with Dr. Smith Mince. Avoid nephrotoxins. I's and O's IV hydration Follow creatinine.  #3 depression/bipolar disorder continue home meds.  #4 Low back pain Tramadol as needed, Tylenol as needed. Avoid narcotics given confusion.  #5 hypertension Hold off BP meds  since blood pressures on the lower side.  #6 DVT prophylaxis subcutaneous heparin 3 times a day.  #7 CODE STATUS full code this was discussed with patient and patient's cousin sister who was healthcare power of attorney.    All the records are reviewed and case  discussed with ED provider. Management plans discussed with the patient, family and they are in agreement.  CODE STATUS:full  TOTAL TIME TAKING CARE OF THIS PATIENT: 39minutes.    Marlinda Miranda M.D on 01/10/2015 at 8:48 PM  Between 7am to 6pm - Pager - [phone]  After 6pm go to [email or web address] - password [email or web address] [facility]  [facility]  Office  [phone]  CC: Primary care physician; No primary care provider on file.

## 2015-01-10 NOTE — ED Notes (Signed)
Pt and bedding was changed after the Pt void on the bed.

## 2015-01-11 ENCOUNTER — Inpatient Hospital Stay: Payer: Medicare Other

## 2015-01-11 DIAGNOSIS — N390 Urinary tract infection, site not specified: Secondary | ICD-10-CM | POA: Diagnosis present

## 2015-01-11 DIAGNOSIS — N183 Chronic kidney disease, stage 3 (moderate): Secondary | ICD-10-CM | POA: Diagnosis present

## 2015-01-11 DIAGNOSIS — Z888 Allergy status to other drugs, medicaments and biological substances status: Secondary | ICD-10-CM | POA: Diagnosis not present

## 2015-01-11 DIAGNOSIS — Z87820 Personal history of traumatic brain injury: Secondary | ICD-10-CM | POA: Diagnosis not present

## 2015-01-11 DIAGNOSIS — M25551 Pain in right hip: Secondary | ICD-10-CM | POA: Diagnosis present

## 2015-01-11 DIAGNOSIS — F3181 Bipolar II disorder: Secondary | ICD-10-CM | POA: Diagnosis present

## 2015-01-11 DIAGNOSIS — R4182 Altered mental status, unspecified: Secondary | ICD-10-CM | POA: Diagnosis present

## 2015-01-11 DIAGNOSIS — N3000 Acute cystitis without hematuria: Secondary | ICD-10-CM | POA: Diagnosis present

## 2015-01-11 DIAGNOSIS — E1121 Type 2 diabetes mellitus with diabetic nephropathy: Secondary | ICD-10-CM | POA: Diagnosis present

## 2015-01-11 DIAGNOSIS — Z886 Allergy status to analgesic agent status: Secondary | ICD-10-CM | POA: Diagnosis not present

## 2015-01-11 DIAGNOSIS — G9341 Metabolic encephalopathy: Secondary | ICD-10-CM | POA: Diagnosis present

## 2015-01-11 DIAGNOSIS — E780 Pure hypercholesterolemia: Secondary | ICD-10-CM | POA: Diagnosis present

## 2015-01-11 DIAGNOSIS — F4310 Post-traumatic stress disorder, unspecified: Secondary | ICD-10-CM | POA: Diagnosis present

## 2015-01-11 DIAGNOSIS — Z9884 Bariatric surgery status: Secondary | ICD-10-CM | POA: Diagnosis not present

## 2015-01-11 DIAGNOSIS — N179 Acute kidney failure, unspecified: Secondary | ICD-10-CM | POA: Diagnosis present

## 2015-01-11 DIAGNOSIS — M545 Low back pain: Secondary | ICD-10-CM | POA: Diagnosis present

## 2015-01-11 DIAGNOSIS — R410 Disorientation, unspecified: Secondary | ICD-10-CM | POA: Diagnosis present

## 2015-01-11 DIAGNOSIS — I129 Hypertensive chronic kidney disease with stage 1 through stage 4 chronic kidney disease, or unspecified chronic kidney disease: Secondary | ICD-10-CM | POA: Diagnosis present

## 2015-01-11 DIAGNOSIS — R4701 Aphasia: Secondary | ICD-10-CM | POA: Diagnosis present

## 2015-01-11 LAB — BASIC METABOLIC PANEL
ANION GAP: 11 (ref 5–15)
BUN: 46 mg/dL — ABNORMAL HIGH (ref 6–20)
CO2: 16 mmol/L — ABNORMAL LOW (ref 22–32)
CREATININE: 1.78 mg/dL — AB (ref 0.44–1.00)
Calcium: 9 mg/dL (ref 8.9–10.3)
Chloride: 112 mmol/L — ABNORMAL HIGH (ref 101–111)
GFR calc Af Amer: 35 mL/min — ABNORMAL LOW (ref >60)
GFR, EST NON AFRICAN AMERICAN: 30 mL/min — AB (ref >60)
Glucose, Bld: 155 mg/dL — ABNORMAL HIGH (ref 65–99)
Potassium: 4 mmol/L (ref 3.5–5.1)
Sodium: 139 mmol/L (ref 135–145)

## 2015-01-11 LAB — CBC
HCT: 31.5 % — ABNORMAL LOW (ref 35.0–47.0)
HEMOGLOBIN: 10.2 g/dL — AB (ref 12.0–16.0)
MCH: 33.3 pg (ref 26.0–34.0)
MCHC: 32.5 g/dL (ref 32.0–36.0)
MCV: 102.4 fL — ABNORMAL HIGH (ref 80.0–100.0)
PLATELETS: 332 10*3/uL (ref 150–440)
RBC: 3.08 MIL/uL — ABNORMAL LOW (ref 3.80–5.20)
RDW: 14.6 % — ABNORMAL HIGH (ref 11.5–14.5)
WBC: 8.6 10*3/uL (ref 3.6–11.0)

## 2015-01-11 MED ORDER — HEPARIN SODIUM (PORCINE) 5000 UNIT/ML IJ SOLN
5000.0000 [IU] | Freq: Three times a day (TID) | INTRAMUSCULAR | Status: DC
  Administered 2015-01-11 – 2015-01-15 (×14): 5000 [IU] via SUBCUTANEOUS
  Filled 2015-01-11 (×14): qty 1

## 2015-01-11 MED ORDER — ACETAMINOPHEN 325 MG PO TABS
650.0000 mg | ORAL_TABLET | Freq: Four times a day (QID) | ORAL | Status: DC | PRN
Start: 2015-01-11 — End: 2015-01-15
  Administered 2015-01-11 – 2015-01-15 (×8): 650 mg via ORAL
  Filled 2015-01-11 (×8): qty 2

## 2015-01-11 MED ORDER — DOCUSATE SODIUM 100 MG PO CAPS
100.0000 mg | ORAL_CAPSULE | Freq: Two times a day (BID) | ORAL | Status: DC
  Administered 2015-01-11 – 2015-01-15 (×9): 100 mg via ORAL
  Filled 2015-01-11 (×9): qty 1

## 2015-01-11 MED ORDER — SENNOSIDES-DOCUSATE SODIUM 8.6-50 MG PO TABS: 1.0000 | ORAL_TABLET | Freq: Every evening | ORAL | Status: DC | PRN

## 2015-01-11 MED ORDER — SODIUM CHLORIDE 0.9 % IV SOLN
INTRAVENOUS | Status: DC
  Administered 2015-01-11: 1000 mL via INTRAVENOUS
  Administered 2015-01-11 – 2015-01-13 (×4): via INTRAVENOUS

## 2015-01-11 MED ORDER — ONDANSETRON HCL 4 MG/2ML IJ SOLN: 4.0000 mg | Freq: Four times a day (QID) | INTRAMUSCULAR | Status: DC | PRN

## 2015-01-11 MED ORDER — ACETAMINOPHEN 650 MG RE SUPP: 650.0000 mg | Freq: Four times a day (QID) | RECTAL | Status: DC | PRN

## 2015-01-11 MED ORDER — ONDANSETRON HCL 4 MG PO TABS: 4.0000 mg | ORAL_TABLET | Freq: Four times a day (QID) | ORAL | Status: DC | PRN

## 2015-01-11 NOTE — Progress Notes (Signed)
PT Cancellation Note  Patient Details Name: Kim Walton MRN: [id number] DOB: May 27, 1956   Cancelled Treatment:    Reason Eval/Treat Not Completed: Patient declined, no reason specified. PT got room set up and began to strike up a conversation with patient. Patient became extremely emotional and began sobbing stating "she can't believe she has let herself in this condition". PT deferred mobility evaluation at this time as patient requested PT come back later today. Patient is only oriented to self and appears very confused.    Kerman Passey, PT, DPT   01/11/2015, 10:48 AM

## 2015-01-11 NOTE — Care Management (Signed)
Admitted to [facility] with the diagnosis of altered mental status. Lives alone. Spoke with 1st cousin, Helene Kelp [place]. [phone]). States that Ms. Davidow's primary care physician is [facility] [place]. Last seen in March. Ms. Tuomi has been to [facility] in [place] following a hip fracture a few years back. Spent 4 weeks there. Followed by Home Health after being released from [facility], but doesn't remember name of agency. Ms. Gharib doesn't drive, Ms. Helene Kelp helps with errands,  Shelbie Ammons RN MSN Care Management [phone]

## 2015-01-11 NOTE — Plan of Care (Signed)
Problem: Discharge Progression Outcomes Goal: Discharge plan in place and appropriate Individualization of Care Patient goes by Kim Walton, lives at home alone, found by her cousin confused and covered in urine.   Past Medical History: CKD, PTSD, Depression, Bipolar, HTN, High cholesterol, currently controlled with her home medications.   Other medical history: C2 cervical fracture and Left femur fracture HIGH fall Precautions

## 2015-01-11 NOTE — Progress Notes (Signed)
[facility] NAME: Kim Walton    MR#:  [id number]  DATE OF BIRTH:  1955-11-11  SUBJECTIVE:  CHIEF COMPLAINT:   Chief Complaint  Patient presents with  . Altered Mental Status   - Very slow to respond, very emotional this morning. -Looks like she has some expressive aphasia today. Unable to provide more history. -Admitted for polypharmacy with pain medications and also urinary tract infection. -Spoke with her cousin Helene Kelp over the phone  REVIEW OF SYSTEMS:  Review of Systems  Constitutional: Negative for fever and chills.  Respiratory: Negative for cough, shortness of breath and wheezing.   Cardiovascular: Negative for chest pain and palpitations.  Gastrointestinal: Negative for nausea, vomiting, abdominal pain, diarrhea and constipation.  Genitourinary: Negative for dysuria.  Neurological: Negative for dizziness, seizures and headaches.    DRUG ALLERGIES:   Allergies  Allergen Reactions  . Betadine [Povidone Iodine] Other (See Comments)    Reaction:  Unknown   . Flexeril [Cyclobenzaprine] Other (See Comments)    Reaction:  Drops blood pressure   . Imitrex [Sumatriptan] Other (See Comments)    Reaction:  Cold sweats   . Lidocaine Hives  . Lotrimin [Clotrimazole] Nausea And Vomiting    VITALS:  Blood pressure 109/39, pulse 98, temperature 99 F (37.2 C), temperature source Oral, resp. rate 16, height 5\' 4"  (1.626 m), weight 71.47 kg (157 lb 9 oz), SpO2 96 %.  PHYSICAL EXAMINATION:  Physical Exam  GENERAL:  59 y.o.-year-old patient lying in the bed with no acute distress.  EYES: Pupils equal, round, reactive to light and accommodation. No scleral icterus. Extraocular muscles intact.  HEENT: Head atraumatic, normocephalic. Oropharynx and nasopharynx clear.  NECK:  Supple, no jugular venous distention. No thyroid enlargement, no tenderness.  LUNGS: Normal breath sounds bilaterally, no wheezing,  rales,rhonchi or crepitation. No use of accessory muscles of respiration.  CARDIOVASCULAR: S1, S2 normal. No murmurs, rubs, or gallops.  ABDOMEN: Soft, nontender, nondistended. Bowel sounds present. No organomegaly or mass.  EXTREMITIES: No pedal edema, cyanosis, or clubbing.  NEUROLOGIC: Cranial nerves II through XII are intact. Muscle strength 5/5 in all extremities. Sensation intact. Gait not checked. Follows commands. Expressive aphasia noted. Slow to respond to questions. PSYCHIATRIC: The patient is alert and oriented x 2.  SKIN: No obvious rash, lesion, or ulcer.    LABORATORY PANEL:   CBC  Recent Labs Lab 01/11/15 0519  WBC 8.6  HGB 10.2*  HCT 31.5*  PLT 332   ------------------------------------------------------------------------------------------------------------------  Chemistries   Recent Labs Lab 01/10/15 1857 01/11/15 0519  NA 135 139  K 4.2 4.0  CL 106 112*  CO2 18* 16*  GLUCOSE 193* 155*  BUN 44* 46*  CREATININE 1.76* 1.78*  CALCIUM 9.2 9.0  AST 56*  --   ALT 19  --   ALKPHOS 101  --   BILITOT 0.6  --    ------------------------------------------------------------------------------------------------------------------  Cardiac Enzymes  Recent Labs Lab 01/10/15 1857  TROPONINI <0.03   ------------------------------------------------------------------------------------------------------------------  RADIOLOGY:  Ct Head Wo Contrast  01/10/2015   CLINICAL DATA:  Altered mental status. Unusual behavior. Urinary incontinence.  EXAM: CT HEAD WITHOUT CONTRAST  TECHNIQUE: Contiguous axial images were obtained from the base of the skull through the vertex without intravenous contrast.  COMPARISON:  None.  FINDINGS: No evidence for acute infarction, hemorrhage, mass lesion, hydrocephalus, or extra-axial fluid. Normal for age cerebral volume. Hypoattenuation of white matter, likely chronic microvascular ischemic change. Calvarium intact. No  sinus or mastoid  air fluid level. Negative orbits.  IMPRESSION: No acute intracranial findings.   Electronically Signed   By: Rolla Flatten M.D.   On: 01/10/2015 20:05   Dg Chest Port 1 View  01/10/2015   CLINICAL DATA:  Altered mental status.  Incontinence.  EXAM: PORTABLE CHEST - 1 VIEW  COMPARISON:  None  FINDINGS: Normal mediastinum and cardiac silhouette. Normal pulmonary vasculature. No evidence of effusion, infiltrate, or pneumothorax. The right humerus is riding high in the glenoid fossa suggesting superior subluxation.  IMPRESSION: 1. No acute cardiopulmonary findings. 2. The right humerus appears subluxed. This could indicate chronic rotator cuff injury. Recommend clinical correlation for acute injury. aa   Electronically Signed   By: Suzy Bouchard M.D.   On: 01/10/2015 19:48    EKG:   Orders placed or performed during the hospital encounter of 01/10/15  . ED EKG  . ED EKG  . EKG 12-Lead  . EKG 12-Lead    ASSESSMENT AND PLAN:    59 year old female with history of diabetes, bipolar, hypertension depression, traumatic brain injury about 20 years ago who lives independently at home presents to the hospital secondary to confusion.  #1 altered mental status-likely metabolic encephalopathy from urinary tract infection. -Also patient is also noted to have taken Flexeril and Tylenol No. 3 pills in the last 4 days dispensed by the emergency room for her back pain. -However with her expressive aphasia, history of traumatic brain injury, we'll get an MRI of the brain to rule out stroke. -Continue to monitor closely at this time. Family is updated.  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 -Follows with [facility] nephrology. If not improving will get renal ultrasound. -Avoid nephrotoxins.  #3 acute cystitis-urine cultures are pending. Continue IV Rocephin. Blood cultures were not sent on admission.  #4 bipolar disorder and depression-continue Topamax,  Zoloft and Lamictal.  #5 DVT prophylaxis-on subcutaneous heparin.   All the records are reviewed and case discussed with Care Management/Social Workerr. Management plans discussed with the patient, family and they are in agreement.  CODE STATUS: Full code  TOTAL TIME TAKING CARE OF THIS PATIENT: 40 minutes.   POSSIBLE D/C IN 2 DAYS, DEPENDING ON CLINICAL CONDITION.   Marycatherine Maniscalco M.D on 01/11/2015 at 11:46 AM  Between 7am to 6pm - Pager - [phone]  After 6pm go to [email or web address] - password [email or web address] [facility]  [facility]  Office  [phone]  CC: Primary care physician; No primary care provider on file.

## 2015-01-11 NOTE — Clinical Social Work Note (Signed)
Clinical Social Worker received consult for discharge planning. PT evaled and recommended SNF. CSW completed FL2 and placed on chart for MD's signature. Full assessment to follow. CSW will continue to follow.   Darden Dates, MSW, LCSW Clinical Social Worker  [phone]

## 2015-01-11 NOTE — Plan of Care (Signed)
Problem: Discharge Progression Outcomes Goal: Barriers To Progression Addressed/Resolved Individualization: Patient goes by Kim Walton, lives at home alone, found by her cousin confused and covered in urine.  Past Medical History: CKD, PTSD, Depression, Bipolar, HTN, High cholesterol, currently controlled with her home medications.  Other medical history: C2 cervical fracture and Left femur fracture.  Goal: Other Discharge Outcomes/Goals Outcome: Progressing Plan of care progress to goals: Barriers- pt is confused currently, only alert to self on admission. This will need to be completed when family is available or pt more oriented. Discharge plan- Pain-no c/o pain since arrival to room 118. Hemodynamics- WBC's elevated 11.2, IVF and IV antibiotics infusing per MD order. VSS.  Complications- Diet-no c/o nausea or vomiting.

## 2015-01-11 NOTE — Progress Notes (Signed)
PT Cancellation Note  Patient Details Name: Kim Walton MRN: [id number] DOB: October 15, 1955   Cancelled Treatment:    Reason Eval/Treat Not Completed: Patient at procedure or test/unavailable (Evaluation re-attempted.  Patient currently leaving unit for diagnostic imaging (MRI).  Will re-attempt at later time/date as patient available and medically appropriate.)   Winnell Bento H. Owens Shark, PT, DPT 01/11/2015, 1:37 PM [phone]

## 2015-01-11 NOTE — Evaluation (Signed)
Physical Therapy Evaluation Patient Details Name: Kim Walton MRN: [id number] DOB: 16-Nov-1955 Today's Date: 01/11/2015   History of Present Illness  presented to ER with AMS, found covered in urine; admitted for AMS, metabolic encephalopathy secondary to UTI.  Of note, recent admission to ER secondary to LBP (discharge homd with medication).  L-spine x-ray negative for acute bony abnormality, noted with degenerative changes 4-5 and L5-S1; head CT and MRI negative for acute change.    Clinical Impression  Upon evaluation, patient alert and oriented to self, location and general situation (with increased time for processing).  Globally confused at times with difficulty maintaining topic of conversation, difficulty following one-step commands and difficulty finding words at times.  Bilat UE/LE strength and ROM appear grossly WFL (through functional observation) for basic transfers and mobility, at least 3-/5 throughout.  Currently able to complete bed mobility with close sup (increased time); sit/stand with RW, mod assist +1.  Unable to attempt bed/chair or gait efforts due to pain in R posterior hip/pelvis; unable to tolerate WBing (with patient initiating spontaneous sit).  May benefit from imaging to rule out injury to R pelvis/sacrum?  Discussed with primary RN who will relay to appropriate physician. Will continue to progress as able. Would benefit from skilled PT to address above deficits and promote optimal return to PLOF; recommend transition to STR upon discharge from acute hospitalization, as patient unsafe to return home alone at this time.    Follow Up Recommendations SNF    Equipment Recommendations  Rolling walker with 5" wheels    Recommendations for Other Services       Precautions / Restrictions Precautions Precautions: Fall Restrictions Weight Bearing Restrictions: No      Mobility  Bed Mobility Overal bed mobility: Needs Assistance Bed Mobility: Supine to Sit;Sit  to Supine     Supine to sit: Supervision Sit to supine: Supervision   General bed mobility comments: able to bridge with bilat UEs (full buttocks clearance from bed surfacE), but with noted increase in R hip/buttocks pain  Transfers Overall transfer level: Needs assistance Equipment used: Rolling walker (2 wheeled) Transfers: Sit to/from Stand Sit to Stand: Mod assist         General transfer comment: heavy use of bilat UEs on RW.  Maintains R LE with excessive ER/abduction, limited WBing with movement transition  Ambulation/Gait             General Gait Details: Unable to tolerate secondary to pain in R hip/buttocks with WBing attempts  Stairs            Wheelchair Mobility    Modified Rankin (Stroke Patients Only)       Balance Overall balance assessment: Needs assistance Sitting-balance support: No upper extremity supported;Feet supported Sitting balance-Leahy Scale: Good     Standing balance support: Bilateral upper extremity supported Standing balance-Leahy Scale: Poor Standing balance comment: min/mod assist to maintain static stance with bilat UEs on RW                             Pertinent Vitals/Pain Pain Assessment: Faces Faces Pain Scale: Hurts whole lot Pain Location: R hip/posterior pelvic/SI joint area Pain Descriptors / Indicators: Aching;Constant;Stabbing Pain Intervention(s): Limited activity within patient's tolerance;Monitored during session;Repositioned    Home Living Family/patient expects to be discharged to:: Private residence Living Arrangements: Alone Available Help at Discharge: Family Type of Home: House  Prior Function           Comments: Indep for household activities without assist device     Hand Dominance        Extremity/Trunk Assessment   Upper Extremity Assessment: Generalized weakness (difficulty maintaining attention to task for formal MMT/ROM--appears at least 3-/5  throughout, functional for basic transfers and mobility)           Lower Extremity Assessment: Generalized weakness (difficulty maintaining attention to task for formal MMT/ROM--appears at least 3-/5 throughout, functional for basic transfers and mobility.  Very painful with all attempts at [place])         Communication   Communication:  (mild word-finding difficulties at times; difficulty sustaining attention to topic, answering open-ended questinos)  Cognition Arousal/Alertness: Awake/alert Behavior During Therapy: WFL for tasks assessed/performed Overall Cognitive Status: Difficult to assess Area of Impairment: Orientation;Attention;Following commands;Safety/judgement Orientation Level: Disoriented to;Time Current Attention Level: Sustained Memory: Decreased short-term memory Following Commands: Follows one step commands with increased time            General Comments      Exercises        Assessment/Plan    PT Assessment Patient needs continued PT services  PT Diagnosis Difficulty walking;Generalized weakness;Acute pain   PT Problem List Decreased strength;Decreased range of motion;Decreased activity tolerance;Decreased balance;Decreased mobility;Decreased coordination;Decreased cognition;Decreased knowledge of use of DME;Decreased knowledge of precautions;Pain  PT Treatment Interventions DME instruction;Gait training;Stair training;Functional mobility training;Therapeutic activities;Therapeutic exercise;Balance training;Cognitive remediation;Patient/family education   PT Goals (Current goals can be found in the Care Plan section) Acute Rehab PT Goals Patient Stated Goal: unable to verbalize formal goal at this time PT Goal Formulation: With patient Time For Goal Achievement: 01/25/15 Potential to Achieve Goals: Fair    Frequency Min 2X/week   Barriers to discharge Decreased caregiver support      Co-evaluation               End of Session  Equipment Utilized During Treatment: Gait belt Activity Tolerance: Patient limited by pain Patient left: in bed;with call bell/phone within reach;with bed alarm set Nurse Communication: Mobility status (potential need for R hip/pelvic xray)         Time: [id number] PT Time Calculation (min) (ACUTE ONLY): 30 min   Charges:   PT Evaluation $Initial PT Evaluation Tier I: 1 Procedure     PT G Codes:        Joshiah Traynham H. Owens Shark, PT, DPT 01/11/2015, 4:11 PM [phone]

## 2015-01-12 ENCOUNTER — Inpatient Hospital Stay: Payer: Medicare Other

## 2015-01-12 NOTE — Progress Notes (Signed)
[facility] [place] NAME: Jillaine Hamada    MR#:  [id number]  DATE OF BIRTH:  02-22-1956  SUBJECTIVE:  CHIEF COMPLAINT:   Chief Complaint  Patient presents with  . Altered Mental Status   - Speech has improved and back to baseline. Mental status normal. -Complains of right flank pain and hip pain.  REVIEW OF SYSTEMS:  Review of Systems  Constitutional: Negative for fever and chills.  Respiratory: Negative for cough, shortness of breath and wheezing.   Cardiovascular: Negative for chest pain and palpitations.  Gastrointestinal: Negative for nausea, vomiting, abdominal pain, diarrhea and constipation.  Genitourinary: Negative for dysuria.  Musculoskeletal: Positive for myalgias and joint pain.       Right flank pain and hip pain  Neurological: Negative for dizziness, seizures and headaches.    DRUG ALLERGIES:   Allergies  Allergen Reactions  . Betadine [Povidone Iodine] Other (See Comments)    Reaction:  Unknown   . Flexeril [Cyclobenzaprine] Other (See Comments)    Reaction:  Drops blood pressure   . Imitrex [Sumatriptan] Other (See Comments)    Reaction:  Cold sweats   . Lidocaine Hives  . Lotrimin [Clotrimazole] Nausea And Vomiting    VITALS:  Blood pressure 141/58, pulse 82, temperature 97.6 F (36.4 C), temperature source Oral, resp. rate 18, height 5\' 4"  (1.626 m), weight 71.47 kg (157 lb 9 oz), SpO2 100 %.  PHYSICAL EXAMINATION:  Physical Exam  GENERAL:  59 y.o.-year-old patient lying in the bed with no acute distress.  EYES: Pupils equal, round, reactive to light and accommodation. No scleral icterus. Extraocular muscles intact.  HEENT: Head atraumatic, normocephalic. Oropharynx and nasopharynx clear.  NECK:  Supple, no jugular venous distention. No thyroid enlargement, no tenderness.  LUNGS: Normal breath sounds bilaterally, no wheezing, rales,rhonchi or crepitation. No use of accessory muscles of  respiration.  CARDIOVASCULAR: S1, S2 normal. No murmurs, rubs, or gallops.  ABDOMEN: Soft, nontender, nondistended. Bowel sounds present. No organomegaly or mass.  EXTREMITIES: No pedal edema, cyanosis, or clubbing.  NEUROLOGIC: Cranial nerves II through XII are intact. Muscle strength 5/5 in all extremities. Sensation intact. Gait not checked. Follows commands. Speech is back to normal today.  PSYCHIATRIC: The patient is alert and oriented x 3 SKIN: No obvious rash, lesion, or ulcer.    LABORATORY PANEL:   CBC  Recent Labs Lab 01/11/15 0519  WBC 8.6  HGB 10.2*  HCT 31.5*  PLT 332   ------------------------------------------------------------------------------------------------------------------  Chemistries   Recent Labs Lab 01/10/15 1857 01/11/15 0519  NA 135 139  K 4.2 4.0  CL 106 112*  CO2 18* 16*  GLUCOSE 193* 155*  BUN 44* 46*  CREATININE 1.76* 1.78*  CALCIUM 9.2 9.0  AST 56*  --   ALT 19  --   ALKPHOS 101  --   BILITOT 0.6  --    ------------------------------------------------------------------------------------------------------------------  Cardiac Enzymes  Recent Labs Lab 01/10/15 1857  TROPONINI <0.03   ------------------------------------------------------------------------------------------------------------------  RADIOLOGY:  Dg Pelvis 1-2 Views  01/11/2015   CLINICAL DATA:  Fall 2 days ago. Right pelvic and hip pain. Initial encounter.  EXAM: PELVIS - 1-2 VIEW  COMPARISON:  None.  FINDINGS: There is no evidence of pelvic fracture or diastasis. No pelvic bone lesions are seen.  Old left hip fracture with surgical hardware seen in place.  There is cortical irregularity along the greater trochanter of the right hip. This could be due to a fracture or degenerative  spurring. Dedicated right hip radiographs recommended for further evaluation.  IMPRESSION: No evidence of pelvic fracture.  Question fracture or degenerative spurring involving the greater  trochanter of the right hip. Dedicated right hip radiographs recommended for further evaluation.   Electronically Signed   By: Earle Gell M.D.   On: 01/11/2015 19:41   Ct Head Wo Contrast  01/10/2015   CLINICAL DATA:  Altered mental status. Unusual behavior. Urinary incontinence.  EXAM: CT HEAD WITHOUT CONTRAST  TECHNIQUE: Contiguous axial images were obtained from the base of the skull through the vertex without intravenous contrast.  COMPARISON:  None.  FINDINGS: No evidence for acute infarction, hemorrhage, mass lesion, hydrocephalus, or extra-axial fluid. Normal for age cerebral volume. Hypoattenuation of white matter, likely chronic microvascular ischemic change. Calvarium intact. No sinus or mastoid air fluid level. Negative orbits.  IMPRESSION: No acute intracranial findings.   Electronically Signed   By: Rolla Flatten M.D.   On: 01/10/2015 20:05   Mr Brain Wo Contrast  01/11/2015   CLINICAL DATA:  59 year old female with altered mental status with confusion. Suspected metabolic encephalopathy related to urinary tract infection. Initial encounter.  EXAM: MRI HEAD WITHOUT CONTRAST  TECHNIQUE: Multiplanar, multiecho pulse sequences of the brain and surrounding structures were obtained without intravenous contrast.  COMPARISON:  Head CT without contrast 01/10/2015.  FINDINGS: Cerebral volume is within normal limits for age. No restricted diffusion to suggest acute infarction. No midline shift, mass effect, evidence of mass lesion, ventriculomegaly, extra-axial collection or acute intracranial hemorrhage. Cervicomedullary junction and pituitary are within normal limits. Negative visualized cervical spine. Normal bone marrow signal. Major intracranial vascular flow voids are within normal limits.  Intermittent mild motion artifact. Mild for age nonspecific cerebral white matter T2 and FLAIR hyperintensity. No cortical encephalomalacia. No chronic blood products identified. There does appear to be a tiny  chronic lacunar infarct in the left cerebellum on series 9, image 5.  Visible internal auditory structures appear normal. Mastoids are clear. Mild paranasal sinus mucosal thickening and left maxillary mucous retention cyst. Postoperative changes to the globes. Visualized scalp soft tissues are within normal limits.  IMPRESSION: 1.  No acute intracranial abnormality. 2. Mild for age nonspecific signal changes, most commonly due to chronic small vessel disease.   Electronically Signed   By: Genevie Ann M.D.   On: 01/11/2015 14:23   Dg Chest Port 1 View  01/10/2015   CLINICAL DATA:  Altered mental status.  Incontinence.  EXAM: PORTABLE CHEST - 1 VIEW  COMPARISON:  None  FINDINGS: Normal mediastinum and cardiac silhouette. Normal pulmonary vasculature. No evidence of effusion, infiltrate, or pneumothorax. The right humerus is riding high in the glenoid fossa suggesting superior subluxation.  IMPRESSION: 1. No acute cardiopulmonary findings. 2. The right humerus appears subluxed. This could indicate chronic rotator cuff injury. Recommend clinical correlation for acute injury. aa   Electronically Signed   By: Suzy Bouchard M.D.   On: 01/10/2015 19:48   Dg Hip Unilat With Pelvis 2-3 Views Right  01/12/2015   CLINICAL DATA:  Onset of severe right hip pain 1 week ago, symptoms have worsened since yesterday, difficulty now with movement  EXAM: RIGHT HIP (WITH PELVIS) 2-3 VIEWS  COMPARISON:  Lumbar spine series of January 07, 2015 and pelvis radiographic February 05, 2012  FINDINGS: The bony pelvis is osteopenic. There is no acute or old fracture. The observed portions of the sacrum and SI joints are unremarkable. There is degenerative disc disease of the lower lumbar spine. The  patient has undergone previous ORIF for an intertrochanteric fracture of the left hip. The right hip joint space is preserved. The right acetabulum and femoral head reveals smoothly rounded articular surfaces.  IMPRESSION: There is no acute bony abnormality  of the pelvis nor right hip.   Electronically Signed   By: David  Martinique M.D.   On: 01/12/2015 09:04    EKG:   Orders placed or performed during the hospital encounter of 01/10/15  . ED EKG  . ED EKG  . EKG 12-Lead  . EKG 12-Lead    ASSESSMENT AND PLAN:    59 year old female with history of diabetes, bipolar, hypertension depression, traumatic brain injury about 20 years ago who lives independently at home presents to the hospital secondary to confusion.  #1 altered mental status-likely metabolic encephalopathy from urinary tract infection and also from taking pain medications. - patient is  noted to have taken Flexeril and Tylenol No. 3 pills in the last 4 days dispensed by the emergency room for her back pain. -MRA of the brain is negative for any acute changes -Patient is more alert today, oriented and her speech is back to normal. 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 -Follows with [facility] nephrology. Recheck tomorrow a.m.-If not improving will get renal ultrasound. -Avoid nephrotoxins.  #3 Acute cystitis-urine cultures are pending-growing gram-negative rods. Continue IV Rocephin. Blood cultures were not sent on admission.  #4 bipolar disorder and depression-continue Topamax, Zoloft and Lamictal.  #5 DVT prophylaxis-on subcutaneous heparin.  Physical therapy has recommended skilled nursing facility at discharge. Possible discharge tomorrow if stable  All the records are reviewed and case discussed with Care Management/Social Workerr. Management plans discussed with the patient, family and they are in agreement.  CODE STATUS: Full code  TOTAL TIME TAKING CARE OF THIS PATIENT: 40 minutes.   POSSIBLE D/C IN 2 DAYS, DEPENDING ON CLINICAL CONDITION.   Daeton Kluth M.D on 01/12/2015 at 1:09 PM  Between 7am to 6pm - Pager - [phone]  After 6pm go to [email or web address] - password [email or web address] [facility]  [place] [place] [facility]  Office  [phone]  CC: Primary care physician; No primary care provider on file.

## 2015-01-12 NOTE — Progress Notes (Signed)
Called to room by patient, foley leaking, would like foley removed. Foley cath removed spoke to Dr Marcille Blanco ok to remove and place back in if patient cannot void

## 2015-01-12 NOTE — Plan of Care (Signed)
Problem: Discharge Progression Outcomes Goal: Other Discharge Outcomes/Goals Plan of care progress to goals:  Barriers- Alert and Oriented. After being admitted with confusion. Discharge plan- Discharge to Rehab. Pain-C/C low back pain. Tylenol 650mg  oral given as ordered. Per MD no narcotics. Hemodynamics- WBC's elevated. Remains on IV antibiotics.                             IVF's infusing.   Complications-No s/s of complications. Diet-Tolerating soft diet well.

## 2015-01-12 NOTE — Progress Notes (Signed)
Physical Therapy Treatment Patient Details Name: Kim Walton MRN: [id number] DOB: 05/13/1956 Today's Date: 01/12/2015    History of Present Illness presented to ER with AMS, found covered in urine; admitted for AMS, metabolic encephalopathy secondary to UTI.  Of note, recent admission to ER secondary to LBP (discharge homd with medication).  L-spine x-ray negative for acute bony abnormality, noted with degenerative changes 4-5 and L5-S1; head CT and MRI negative for acute change.  Also received R hip/pelvis x-ray 6/9; negative for acute, bony injury.    PT Comments    Continues to be limited by pain in R posterior pelvis/hip, but improved to 5/10 from previous session.  Diagnostic imaging negative for bony injury.  Somewhat self-limiting.   Follow Up Recommendations  SNF     Equipment Recommendations  Rolling walker with 5" wheels    Recommendations for Other Services       Precautions / Restrictions Precautions Precautions: Fall Restrictions Weight Bearing Restrictions: No    Mobility  Bed Mobility Overal bed mobility: Modified Independent Bed Mobility: Supine to Sit;Sit to Supine           General bed mobility comments: able to actively negotiate LEs on/off bed surface without assist from therapist  Transfers Overall transfer level: Needs assistance Equipment used: Rolling walker (2 wheeled) Transfers: Sit to/from Stand Sit to Stand: Mod assist         General transfer comment: heavy use of bilat UEs on RW.  Maintains R LE with excessive ER/abduction, limited WBing with movement transition. Unable to achieve full, erect stance; spontaneously sitting with each standing trial.  Ambulation/Gait             General Gait Details: Unable to tolerate secondary to pain in R hip/buttocks with WBing attempts   Stairs            Wheelchair Mobility    Modified Rankin (Stroke Patients Only)       Balance                                     Cognition Arousal/Alertness: Awake/alert Behavior During Therapy: WFL for tasks assessed/performed Overall Cognitive Status: Within Functional Limits for tasks assessed                      Exercises Other Exercises Other Exercises: Able to actively SLR on bilat LEs, but with decreased range R > L LE; unchanged with compression/distraction of pelvis.   Other Exercises: Bridging with symmetrical pelvis x3 reps; unable to fully clear buttocks.  Fair/good WBing bilat LEs. Other Exercises: Muscle energy techniques to pelvis to decrease spasm; patient with transient increase pain reported with resisted abduction.  Otherwise, unchanged with techniques.    General Comments        Pertinent Vitals/Pain Pain Score: 5  Pain Location: R hip/posterior pelvic/SI joint area Pain Descriptors / Indicators: Aching;Spasm Pain Intervention(s): Limited activity within patient's tolerance;Monitored during session;Repositioned    Home Living                      Prior Function            PT Goals (current goals can now be found in the care plan section) Acute Rehab PT Goals Patient Stated Goal: unable to verbalize formal goal at this time PT Goal Formulation: With patient Time For Goal Achievement: 01/25/15 Potential to Achieve  Goals: Fair Progress towards PT goals: Progressing toward goals    Frequency  Min 2X/week    PT Plan Current plan remains appropriate    Co-evaluation             End of Session Equipment Utilized During Treatment: Gait belt Activity Tolerance: Patient limited by pain Patient left: in bed;with call bell/phone within reach;with bed alarm set     Time: 1510-1536 PT Time Calculation (min) (ACUTE ONLY): 26 min  Charges:  $Therapeutic Exercise: 8-22 mins $Therapeutic Activity: 8-22 mins                    G Codes:      Ingrid Shifrin H. Owens Shark, PT, DPT 01/12/2015, 4:49 PM [phone]

## 2015-01-13 LAB — BASIC METABOLIC PANEL
Anion gap: 10 (ref 5–15)
BUN: 28 mg/dL — ABNORMAL HIGH (ref 6–20)
CHLORIDE: 113 mmol/L — AB (ref 101–111)
CO2: 18 mmol/L — AB (ref 22–32)
Calcium: 9.2 mg/dL (ref 8.9–10.3)
Creatinine, Ser: 1.22 mg/dL — ABNORMAL HIGH (ref 0.44–1.00)
GFR, EST AFRICAN AMERICAN: 55 mL/min — AB (ref >60)
GFR, EST NON AFRICAN AMERICAN: 47 mL/min — AB (ref >60)
Glucose, Bld: 137 mg/dL — ABNORMAL HIGH (ref 65–99)
POTASSIUM: 3.7 mmol/L (ref 3.5–5.1)
Sodium: 141 mmol/L (ref 135–145)

## 2015-01-13 LAB — CBC
HCT: 28.8 % — ABNORMAL LOW (ref 35.0–47.0)
Hemoglobin: 9.2 g/dL — ABNORMAL LOW (ref 12.0–16.0)
MCH: 32.6 pg (ref 26.0–34.0)
MCHC: 32.1 g/dL (ref 32.0–36.0)
MCV: 101.6 fL — AB (ref 80.0–100.0)
Platelets: 340 10*3/uL (ref 150–440)
RBC: 2.83 MIL/uL — AB (ref 3.80–5.20)
RDW: 14.2 % (ref 11.5–14.5)
WBC: 7.7 10*3/uL (ref 3.6–11.0)

## 2015-01-13 LAB — URINE CULTURE: Culture: >=100000

## 2015-01-13 MED ORDER — METHYLPREDNISOLONE 4 MG PO TBPK: 4.0000 mg | ORAL_TABLET | Freq: Four times a day (QID) | ORAL | Status: DC

## 2015-01-13 MED ORDER — METHYLPREDNISOLONE 4 MG PO TABS
8.0000 mg | ORAL_TABLET | Freq: Every evening | ORAL | Status: DC
  Administered 2015-01-14: 21:00:00 8 mg via ORAL

## 2015-01-13 MED ORDER — METHYLPREDNISOLONE 4 MG PO TBPK: 8.0000 mg | ORAL_TABLET | Freq: Every evening | ORAL | Status: DC

## 2015-01-13 MED ORDER — METHYLPREDNISOLONE 4 MG PO TABS
4.0000 mg | ORAL_TABLET | ORAL | Status: AC
  Administered 2015-01-13: 4 mg via ORAL
  Filled 2015-01-13 (×3): qty 1

## 2015-01-13 MED ORDER — METHYLPREDNISOLONE 4 MG PO TBPK: 4.0000 mg | ORAL_TABLET | ORAL | Status: DC

## 2015-01-13 MED ORDER — METHYLPREDNISOLONE 4 MG PO TBPK: 4.0000 mg | ORAL_TABLET | Freq: Three times a day (TID) | ORAL | Status: DC

## 2015-01-13 MED ORDER — METHYLPREDNISOLONE 4 MG PO TABS
8.0000 mg | ORAL_TABLET | Freq: Every evening | ORAL | Status: AC
  Administered 2015-01-13: 21:00:00 8 mg via ORAL
  Filled 2015-01-13: qty 2

## 2015-01-13 MED ORDER — METHYLPREDNISOLONE 4 MG PO TABS
4.0000 mg | ORAL_TABLET | Freq: Four times a day (QID) | ORAL | Status: DC
  Administered 2015-01-15 (×2): 4 mg via ORAL

## 2015-01-13 MED ORDER — TRAMADOL HCL 50 MG PO TABS
50.0000 mg | ORAL_TABLET | Freq: Four times a day (QID) | ORAL | Status: DC | PRN
  Administered 2015-01-13 – 2015-01-15 (×8): 50 mg via ORAL
  Filled 2015-01-13 (×8): qty 1

## 2015-01-13 MED ORDER — METHYLPREDNISOLONE 4 MG PO TABS
8.0000 mg | ORAL_TABLET | Freq: Every day | ORAL | Status: AC
  Administered 2015-01-13: 8 mg via ORAL
  Filled 2015-01-13: qty 2

## 2015-01-13 MED ORDER — METHYLPREDNISOLONE 4 MG PO TBPK
8.0000 mg | ORAL_TABLET | Freq: Every morning | ORAL | Status: DC
  Filled 2015-01-13: qty 21

## 2015-01-13 MED ORDER — METHYLPREDNISOLONE 4 MG PO TABS
4.0000 mg | ORAL_TABLET | ORAL | Status: AC
  Administered 2015-01-13: 4 mg via ORAL
  Filled 2015-01-13: qty 1

## 2015-01-13 MED ORDER — METHYLPREDNISOLONE 4 MG PO TABS
4.0000 mg | ORAL_TABLET | Freq: Three times a day (TID) | ORAL | Status: AC
  Administered 2015-01-14 (×3): 4 mg via ORAL
  Filled 2015-01-13 (×2): qty 1
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
## 2015-01-13 NOTE — Plan of Care (Signed)
Problem: Discharge Progression Outcomes Goal: Barriers To Progression Addressed/Resolved Individualization:  Outcome: Progressing Patient goes by Kim Walton, lives at home alone, found by her cousin confused and covered in urine.     Past Medical History: CKD, PTSD, Depression, Bipolar, HTN, High cholesterol, currently controlled with her home medications.     Other medical history: C2 cervical fracture and Left femur fracture HIGH fall Precautions Goal: Other Discharge Outcomes/Goals Outcome: Progressing Plan of care progress to goal:   Discharge plan: per md note plan is to d/c to snf or rehab ? Today   Pain: Pain medications given with noted relief.  Hemo: VSS, IVF continues to infuse per md order   Temp : temp WNL this shift

## 2015-01-13 NOTE — Plan of Care (Signed)
Problem: Discharge Progression Outcomes Goal: Barriers To Progression Addressed/Resolved Individualization:  Outcome: Progressing Individualization of Care Patient goes by Kim Walton, lives at home alone, found by her cousin confused and covered in urine.    Past Medical History: CKD, PTSD, Depression, Bipolar, HTN, High cholesterol, currently controlled with her home medications.    Other medical history: C2 cervical fracture and Left femur fracture HIGH fall Precautions Goal: Other Discharge Outcomes/Goals Outcome: Progressing Plan of care progress to goal:  Discharge plan: per md note plan is to d/c to snf or rehab ? Today  Pain: continues to have right hip pain with mild relief NO NARCOTICS per md  Hemo: VSS, IVF continues to infuse per md order  Temp : temp WNL this shift

## 2015-01-14 MED ORDER — SODIUM CHLORIDE 0.9 % IJ SOLN
3.0000 mL | INTRAMUSCULAR | Status: DC | PRN
  Administered 2015-01-15: 3 mL via INTRAVENOUS
  Filled 2015-01-14: qty 10

## 2015-01-14 MED ORDER — SODIUM CHLORIDE 0.9 % IJ SOLN
3.0000 mL | Freq: Three times a day (TID) | INTRAMUSCULAR | Status: DC
  Administered 2015-01-14 – 2015-01-15 (×5): 3 mL via INTRAVENOUS

## 2015-01-14 NOTE — Clinical Social Work Note (Signed)
Clinical Social Work Assessment  Patient Details  Name: Kim Walton MRN: [id number] Date of Birth: 1955/10/31  Date of referral:  01/14/15               Reason for consult:  Facility Placement                Permission sought to share information with:  Facility Sport and exercise psychologist, Family Supports Permission granted to share information::  Yes, Verbal Permission Granted  Name::        Agency::  [facility] [place]  Relationship::  HCPOA; Dtr  Contact Information:     Housing/Transportation Living arrangements for the past 2 months:  [place] of Information:  Patient, Adult Children, Power of Attorney Patient Interpreter Needed:  None Criminal Activity/Legal Involvement Pertinent to Current Situation/Hospitalization:  No - Comment as needed Significant Relationships:  Adult Children Lives with:  Self Do you feel safe going back to the place where you live?  Yes Need for family participation in patient care:  Yes (Comment)  Care giving concerns:    Facilities manager / plan:  CSW met with pt and pt's HCPOA re: PT recommendation for SNF. Pt with previous stay at [facility] in [place]. Pt and pt's HCPOA report the plan is for d/c to [facility] in [place]. FL2 complete and referral made to [facility]. Anticiapte d/c Monday.   Employment status:  Disabled (Comment on whether or not currently receiving Disability) Insurance information:  Medicare PT Recommendations:  [place] / Referral to community resources:  [place]  Patient/Family's Response to care:  Pt and pt's HCPOA report agreeable to above plan.  Patient/Family's Understanding of and Emotional Response to Diagnosis, Current Treatment, and Prognosis:    Emotional Assessment Appearance:  Appears older than stated age Attitude/Demeanor/Rapport:    Affect (typically observed):  Accepting Orientation:  Oriented to Self, Oriented to Place, Oriented to  Time,  Oriented to Situation Alcohol / Substance use:  Not Applicable Psych involvement (Current and /or in the community):  No (Comment)  Discharge Needs  Concerns to be addressed:  Discharge Planning Concerns Readmission within the last 30 days:  No Current discharge risk:  None Barriers to Discharge:      Amador Cunas, LCSW 01/14/2015, 2:18 PM

## 2015-01-14 NOTE — Plan of Care (Signed)
Problem: Discharge Progression Outcomes Goal: Other Discharge Outcomes/Goals Plan of care progress to goals: 1. Discharge plan: per MD note plan is to d/c to snf for rehab     2. Pain: Tramadol given with noted relief for chronic pain 3. Hemodynamically: VSS, afebrile, IVF infusing. Pt continues to be incontinent. 4. Activity: pt remained in bed during shift requiring assistance moving in bed No fall/injury this shift. Will continue to assess.

## 2015-01-14 NOTE — Plan of Care (Addendum)
Problem: Discharge Progression Outcomes Goal: Other Discharge Outcomes/Goals Outcome: Progressing Progress to goals: 1. Pt unwilling to advance activity- states she will wait for rehab. Refused to get OOB to [facility] or use bedpan due to concern of increased low back and right leg pain. Continues at bedrest despite encouragement to get OOB. Voids in diaper and then calls staff to change her.  2. Anticipate discharge to rehab tomorrow in [place]. 3. Pain well controlled with tramadol with K pad provided today with pt utilizing and reports heat providing comfort. 4. Condition stable; meeting discharge needs.
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
## 2015-01-14 NOTE — Progress Notes (Signed)
[facility] at [place] NAME: Kim Walton    MR#:  [id number]  DATE OF BIRTH:  01-26-1956  SUBJECTIVE:  CHIEF COMPLAINT:   Chief Complaint  Patient presents with  . Altered Mental Status   - Mental status back to normal. Much improved low back pain today. Tramadol and heating pad ordered. -To work with physical therapy and possible discharge to rehabilitation tomorrow.  REVIEW OF SYSTEMS:  Review of Systems  Constitutional: Negative for fever and chills.  Respiratory: Negative for cough, shortness of breath and wheezing.   Cardiovascular: Negative for chest pain and palpitations.  Gastrointestinal: Negative for nausea, vomiting, abdominal pain, diarrhea and constipation.  Genitourinary: Negative for dysuria.  Musculoskeletal: Positive for myalgias and joint pain.       Right flank pain and hip pain  Neurological: Negative for dizziness, seizures and headaches.    DRUG ALLERGIES:   Allergies  Allergen Reactions  . Betadine [Povidone Iodine] Other (See Comments)    Reaction:  Unknown   . Flexeril [Cyclobenzaprine] Other (See Comments)    Reaction:  Drops blood pressure   . Imitrex [Sumatriptan] Other (See Comments)    Reaction:  Cold sweats   . Lidocaine Hives  . Lotrimin [Clotrimazole] Nausea And Vomiting    VITALS:  Blood pressure 151/73, pulse 79, temperature 97.8 F (36.6 C), temperature source Oral, resp. rate 18, height 5\' 4"  (1.626 m), weight 71.47 kg (157 lb 9 oz), SpO2 99 %.  PHYSICAL EXAMINATION:  Physical Exam  GENERAL:  59 y.o.-year-old patient lying in the bed with no acute distress.  EYES: Pupils equal, round, reactive to light and accommodation. No scleral icterus. Extraocular muscles intact.  HEENT: Head atraumatic, normocephalic. Oropharynx and nasopharynx clear.  NECK:  Supple, no jugular venous distention. No thyroid enlargement, no tenderness.  LUNGS: Normal breath sounds bilaterally, no  wheezing, rales,rhonchi or crepitation. No use of accessory muscles of respiration.  CARDIOVASCULAR: S1, S2 normal. No murmurs, rubs, or gallops.  ABDOMEN: Soft, nontender, nondistended. Bowel sounds present. No organomegaly or mass.  EXTREMITIES: No pedal edema, cyanosis, or clubbing.  Normal SLR test  NEUROLOGIC: Cranial nerves II through XII are intact. Muscle strength 5/5 in all extremities. Sensation intact. Gait not checked. Follows commands. Speech is back to normal today.  PSYCHIATRIC: The patient is alert and oriented x 3 SKIN: No obvious rash, lesion, or ulcer.    LABORATORY PANEL:   CBC  Recent Labs Lab 01/13/15 0445  WBC 7.7  HGB 9.2*  HCT 28.8*  PLT 340   ------------------------------------------------------------------------------------------------------------------  Chemistries   Recent Labs Lab 01/10/15 1857  01/13/15 0445  NA 135  < > 141  K 4.2  < > 3.7  CL 106  < > 113*  CO2 18*  < > 18*  GLUCOSE 193*  < > 137*  BUN 44*  < > 28*  CREATININE 1.76*  < > 1.22*  CALCIUM 9.2  < > 9.2  AST 56*  --   --   ALT 19  --   --   ALKPHOS 101  --   --   BILITOT 0.6  --   --   < > = values in this interval not displayed. ------------------------------------------------------------------------------------------------------------------  Cardiac Enzymes  Recent Labs Lab 01/10/15 1857  TROPONINI <0.03   ------------------------------------------------------------------------------------------------------------------  RADIOLOGY:  No results found.  EKG:   Orders placed or performed during the hospital encounter of 01/10/15  . ED EKG  . ED  EKG  . EKG 12-Lead  . EKG 12-Lead    ASSESSMENT AND PLAN:    59 year old female with history of diabetes, bipolar, hypertension depression, traumatic brain injury about 20 years ago who lives independently at home presents to the hospital secondary to confusion.  #1 Altered mental status-likely metabolic  encephalopathy from urinary tract infection and also from taking pain medications. - patient is  noted to have taken Flexeril and Tylenol No. 3 pills in the last 4 days dispensed by the emergency room for her back pain. -MRI of the brain is negative for any acute changes -Patient is back to normal. 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 -Follows with [facility] nephrology. Improving cr- close to baseline now -Avoid nephrotoxins.  #3 Acute cystitis-urine cultures are pending-growing E.coli. Was on IV Rocephin. Changed to keflex. Stop date is 01/15/15 Blood cultures were not sent on admission.  #4 bipolar disorder and depression-continue Topamax, Zoloft and Lamictal.  #5 Right hip/back pain- X rays of pevis and Hip are negative, likely prolapsed disc or muscular pain - added tramadol prn, heating pad and medrol dose pack and monitor-much improved symptoms at this time. -Patient had prior left hip surgery done by Dr. Rudene Christians, family and patient would like to see orthopedics for the same, though x-rays indicate that the left hip is stable. Dr. Rudene Christians is notified, he will see the patient this weekend. - physical therapy consult  #6 DVT prophylaxis-on subcutaneous heparin.  Physical therapy has recommended skilled nursing facility at discharge.  All the records are reviewed and case discussed with Care Management/Social Workerr. Management plans discussed with the patient, family and they are in agreement.  CODE STATUS: Full code  TOTAL TIME TAKING CARE OF THIS PATIENT: 40 minutes.   POSSIBLE D/C TO REHAB WHEN BED AVAILABLE, DEPENDING ON CLINICAL CONDITION. Patient and family [facility]. Social worker notified. Possible discharge whenever bed available.  Gladstone Lighter M.D on 01/14/2015 at 9:27 AM  Between 7am to 6pm - Pager - [phone]  After 6pm go to [email or web address] - password [email or web address]  [facility]  [facility]  Office  [phone]  CC: Primary care physician; No primary care provider on file.

## 2015-01-15 MED ORDER — ACETAMINOPHEN-CODEINE #3 300-30 MG PO TABS: 2.0000 | ORAL_TABLET | ORAL | Status: DC | PRN

## 2015-01-15 MED ORDER — DOCUSATE SODIUM 100 MG PO CAPS: 100.0000 mg | ORAL_CAPSULE | Freq: Two times a day (BID) | ORAL | Status: DC

## 2015-01-15 NOTE — Progress Notes (Signed)
Patient being discharged to SNF.  Report given to Voncille Lo at facility. Will transport by EMS.

## 2015-01-15 NOTE — Clinical Social Work Placement (Signed)
   CLINICAL SOCIAL WORK PLACEMENT  NOTE  Date:  01/15/2015  Patient Details  Name: Kim Walton MRN: [id number] Date of Birth: 03/16/1956  Clinical Social Work is seeking post-discharge placement for this patient at the [place] level of care (*CSW will initial, date and re-position this form in  chart as items are completed):  Yes   Patient/family provided with [facility] Work Department's list of facilities offering this level of care within the geographic area requested by the patient (or if unable, by the patient's family).  Yes   Patient/family informed of their freedom to choose among providers that offer the needed level of care, that participate in Medicare, Medicaid or managed care program needed by the patient, have an available bed and are willing to accept the patient.  Yes   Patient/family informed of [facility]'s ownership interest in [facility] and [facility], as well as of the fact that they are under no obligation to receive care at these facilities.  PASRR submitted to EDS on 01/15/15     PASRR number received on 01/15/15     Existing PASRR number confirmed on       FL2 transmitted to all facilities in geographic area requested by pt/family on 01/14/15     FL2 transmitted to all facilities within larger geographic area on       Patient informed that his/her managed care company has contracts with or will negotiate with certain facilities, including the following:        Yes   Patient/family informed of bed offers received.  Patient chooses bed at  ([facility])     Physician recommends and patient chooses bed at      Patient to be transferred to  ([facility]) on 01/15/15.  Patient to be transferred to facility by EMS     Patient family notified on 01/15/15 of transfer.  Name of family member notified:        PHYSICIAN       Additional Comment:     _______________________________________________ Naida Sleight, LCSW 01/15/2015, 1:14 PM

## 2015-01-15 NOTE — Clinical Social Work Note (Signed)
Pt is ready for d/c today.  Pt and staff at [facility] were informed.  D/c documentation was sent to facility.  CSW provided room and report information to RN.  Pt will be transported via EMS.  CSW signing off as there are no further needs at this time.  [place], [place]

## 2015-01-15 NOTE — Discharge Summary (Addendum)
[facility] at [place] NAME: Kim Walton    MR#:  [id number]  DATE OF BIRTH:  [birth year]  DATE OF ADMISSION:  01/10/2015 ADMITTING PHYSICIAN: Fritzi Mandes, MD  DATE OF DISCHARGE: 01/15/2015  PRIMARY CARE PHYSICIAN: No primary care provider on file.    ADMISSION DIAGNOSIS:  Delirium, acute [R41.0] Polypharmacy [Z79.899] Urinary tract infection, acute [N39.0]  DISCHARGE DIAGNOSIS:  Active Problems:   Altered mental state  SECONDARY DIAGNOSIS:   Past Medical History  Diagnosis Date  . Chronic kidney disease   . C2 cervical fracture   . PTSD (post-traumatic stress disorder)   . Femur fracture, left   . Hip fracture, left   . Depression   . Bipolar 2 disorder, major depressive episode   . Hypertension   . High cholesterol    HOSPITAL COURSE:  59 year old female with the above-mentioned medical problems admitted for altered mental status thought to be likely metabolic encephalopathy from UTI and too much narcotics.  #1 Altered mental status-likely metabolic encephalopathy from urinary tract infection and also from taking pain medications. - patient was noted to have taken Flexeril and Tylenol No. 3 pills in the last 4 days dispensed by the emergency room for her back pain. -MRI of the brain is negative for any acute changes -Patient is back to normal. 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 -Follows with [facility] nephrology. Improving cr- close to baseline now -Avoid nephrotoxins.  #3 Acute cystitis-urine cultures are pending-growing E.coli. Was on IV Rocephin. Changed to keflex. Stop date is 01/15/15  #4 bipolar disorder and depression-continue Topamax, Zoloft and Lamictal.  #5 possible abductor muscle partial tear: Ortho recommendation is Physical therapy for now and outpt follow up with Dr Rudene Christians in 2 weeks after rehabilitation and make sure she  has recovered from this.   Patient is feeling much better and is close to her baseline mental status and is being discharged back to rehabilitation in stable condition. She is agreeable with discharge plans.  DISCHARGE CONDITIONS:   Good - back to baseline  CONSULTS OBTAINED:  Treatment Team:  Hessie Knows, MD  DRUG ALLERGIES:   Allergies  Allergen Reactions  . Betadine [Povidone Iodine] Other (See Comments)    Reaction:  Unknown   . Flexeril [Cyclobenzaprine] Other (See Comments)    Reaction:  Drops blood pressure   . Imitrex [Sumatriptan] Other (See Comments)    Reaction:  Cold sweats   . Lidocaine Hives  . Lotrimin [Clotrimazole] Nausea And Vomiting   DISCHARGE MEDICATIONS:   Current Discharge Medication List    START taking these medications   Details  docusate sodium (COLACE) 100 MG capsule Take 1 capsule (100 mg total) by mouth 2 (two) times daily. Qty: 10 capsule, Refills: 0      CONTINUE these medications which have CHANGED   Details  acetaminophen-codeine (TYLENOL #3) 300-30 MG per tablet Take 2 tablets by mouth every 4 (four) hours as needed for moderate pain. Qty: 10 tablet, Refills: 0      CONTINUE these medications which have NOT CHANGED   Details  aspirin 81 MG chewable tablet Chew 81 mg by mouth daily.    atenolol (TENORMIN) 25 MG tablet Take 25 mg by mouth 2 (two) times daily.    B Complex-Biotin-FA (B COMPLETE) TABS Take 1 tablet by mouth daily.    Biotin 10 MG CAPS Take  1 capsule by mouth daily.    Calcium Carbonate-Vitamin D (CALCIUM + D PO) Take 1 tablet by mouth 2 (two) times daily.    chlorthalidone (HYGROTON) 25 MG tablet Take 12.5 mg by mouth every morning.    Cranberry 500 MG CAPS Take 1 capsule by mouth 2 (two) times daily.    Iron TABS Take 1 tablet by mouth daily.    lamoTRIgine (LAMICTAL) 100 MG tablet Take 100 mg by mouth 2 (two) times daily.    lisinopril (PRINIVIL,ZESTRIL) 2.5 MG tablet Take 2.5 mg by mouth at bedtime.     Melatonin 5 MG CAPS Take 1 capsule by mouth at bedtime.    sertraline (ZOLOFT) 100 MG tablet Take 100 mg by mouth daily.    simvastatin (ZOCOR) 20 MG tablet Take 20 mg by mouth at bedtime.    sodium bicarbonate 650 MG tablet Take 650 mg by mouth 2 (two) times daily.    topiramate (TOPAMAX) 50 MG tablet Take 50 mg by mouth 2 (two) times daily.    vitamin C (ASCORBIC ACID) 500 MG tablet Take 500 mg by mouth daily.        DISCHARGE INSTRUCTIONS:    DIET:  Renal diet  DISCHARGE CONDITION:  Good  ACTIVITY:  Activity as tolerated  OXYGEN:  Home Oxygen: No.   Oxygen Delivery: room air  DISCHARGE LOCATION:  nursing home   If you experience worsening of your admission symptoms, develop shortness of breath, life threatening emergency, suicidal or homicidal thoughts you must seek medical attention immediately by calling 911 or calling your MD immediately  if symptoms less severe.  You Must read complete instructions/literature along with all the possible adverse reactions/side effects for all the Medicines you take and that have been prescribed to you. Take any new Medicines after you have completely understood and accpet all the possible adverse reactions/side effects.   Please note  You were cared for by a hospitalist during your hospital stay. If you have any questions about your discharge medications or the care you received while you were in the hospital after you are discharged, you can call the unit and asked to speak with the hospitalist on call if the hospitalist that took care of you is not available. Once you are discharged, your primary care physician will handle any further medical issues. Please note that NO REFILLS for any discharge medications will be authorized once you are discharged, as it is imperative that you return to your primary care physician (or establish a relationship with a primary care physician if you do not have one) for your aftercare needs so that  they can reassess your need for medications and monitor your lab values.   On the day of Discharge:   VITAL SIGNS:  Blood pressure 158/63, pulse 83, temperature 97.6 F (36.4 C), temperature source Oral, resp. rate 18, height 5\' 4"  (1.626 m), weight 71.47 kg (157 lb 9 oz), SpO2 99 %.  I/O:   Intake/Output Summary (Last 24 hours) at 01/15/15 0809 Last data filed at 01/15/15 0600  Gross per 24 hour  Intake   1406 ml  Output      0 ml  Net   1406 ml    PHYSICAL EXAMINATION:  GENERAL:  58 y.o.-year-old patient lying in the bed with no acute distress.  EYES: Pupils equal, round, reactive to light and accommodation. No scleral icterus. Extraocular muscles intact.  HEENT: Head atraumatic, normocephalic. Oropharynx and nasopharynx clear.  NECK:  Supple, no jugular venous  distention. No thyroid enlargement, no tenderness.  LUNGS: Normal breath sounds bilaterally, no wheezing, rales,rhonchi or crepitation. No use of accessory muscles of respiration.  CARDIOVASCULAR: S1, S2 normal. No murmurs, rubs, or gallops.  ABDOMEN: Soft, non-tender, non-distended. Bowel sounds present. No organomegaly or mass.  EXTREMITIES: No pedal edema, cyanosis, or clubbing.  NEUROLOGIC: Cranial nerves II through XII are intact. Muscle strength 5/5 in all extremities. Sensation intact. Gait not checked.  PSYCHIATRIC: The patient is alert and oriented x 3.  SKIN: No obvious rash, lesion, or ulcer.   DATA REVIEW:   CBC  Recent Labs Lab 01/13/15 0445  WBC 7.7  HGB 9.2*  HCT 28.8*  PLT 340    Chemistries   Recent Labs Lab 01/10/15 1857  01/13/15 0445  NA 135  < > 141  K 4.2  < > 3.7  CL 106  < > 113*  CO2 18*  < > 18*  GLUCOSE 193*  < > 137*  BUN 44*  < > 28*  CREATININE 1.76*  < > 1.22*  CALCIUM 9.2  < > 9.2  AST 56*  --   --   ALT 19  --   --   ALKPHOS 101  --   --   BILITOT 0.6  --   --   < > = values in this interval not displayed.  Cardiac Enzymes  Recent Labs Lab 01/10/15 1857   TROPONINI <0.03    Microbiology Results  Results for orders placed or performed during the hospital encounter of 01/10/15  Urine culture     Status: None   Collection Time: 01/10/15  6:57 PM  Result Value Ref Range Status   Specimen Description URINE, RANDOM  Final   Special Requests NONE  Final   Culture >=100,000 COLONIES/mL ESCHERICHIA COLI  Final   Report Status 01/13/2015 FINAL  Final   Organism ID, Bacteria ESCHERICHIA COLI  Final      Susceptibility   Escherichia coli - MIC*    AMPICILLIN >=32 RESISTANT Resistant     CEFTAZIDIME <=1 SENSITIVE Sensitive     CEFAZOLIN <=4 SENSITIVE Sensitive     CEFTRIAXONE <=1 SENSITIVE Sensitive     CIPROFLOXACIN <=0.25 SENSITIVE Sensitive     GENTAMICIN <=1 SENSITIVE Sensitive     IMIPENEM <=0.25 SENSITIVE Sensitive     TRIMETH/SULFA <=20 SENSITIVE Sensitive     CEFOXITIN <=4 SENSITIVE Sensitive     * >=100,000 COLONIES/mL ESCHERICHIA COLI   Management plans discussed with the patient and she is in agreement.  CODE STATUS:     Code Status Orders        Start     Ordered   01/11/15 0213  Full code   Continuous     01/11/15 0212      TOTAL TIME TAKING CARE OF THIS PATIENT: 55 minutes.   Greater than 50% of time was spent in counseling and coordination of care.  She remains at very high risk for readmissions.  [facility], Bernd Crom M.D on 01/15/2015 at 8:09 AM  Between 7am to 6pm - Pager - [phone]  After 6pm go to [email or web address] - password [email or web address] [facility]  [facility]  Office  [phone]  CC: Primary care physician; Tera Helper, MD ([facility], [facility] [place], [place]) Hessie Knows, MD [facility])

## 2015-01-15 NOTE — Plan of Care (Signed)
Problem: Discharge Progression Outcomes Goal: Other Discharge Outcomes/Goals Outcome: Progressing Plan of care progress to goals: Discharge plan- plan is to d/c to snf for rehab, [facility], [place]. Pain- Tramadol given with noted relief for chronic lower back pain. Hemodynamically: BUN and Creatinine improving, afebrile this shift, otherwise VSS. s to be incontinent. Activity- pt remained in bed during shift requiring assistance moving in bed; patient continues to be incontinent of urine and stool.  No fall/injury this shift. Will continue to assess.

## 2015-01-15 NOTE — H&P (Signed)
[facility] NAME: Kim Walton   MR#: [id number]  DATE OF BIRTH: [birth year]  DATE OF ADMISSION: 01/10/2015  PRIMARY CARE PHYSICIAN: Dr Fayette Pho in [place]  REQUESTING/REFERRING PHYSICIAN: Dr Jacqualine Code  CHIEF COMPLAINT:  Altered mental status  HISTORY OF PRESENT ILLNESS:  Kim Walton is a 59 y.o. female with a known history of DM-2 (not on meds),bipolar disorder hypertension hyperlipidemia depression, PTSD, chronic kidney disease status post kidney biopsy a few months ago came to the emergency room brought in by patient's cousin after she was found to have altered mental status, significant weakness and increasing fatigability.  Patient in the emergency room was found to be tachycardic with cloudy urine suggestive of UTI. Patient recently was seen in the emergency room on June 5 with low back pain and was given Flexeril and Tylenol No. 3. She was prescribed total 30 pills and 19 pills remaining. There was a question of polypharmacy and possible UTI. Patient received dose of IV Rocephin she is being admitted for further hypertension management.  PAST MEDICAL HISTORY:   Past Medical History  Diagnosis Date  . Chronic kidney disease   . C2 cervical fracture   . PTSD (post-traumatic stress disorder)   . Femur fracture, left   . Hip fracture, left   . Depression   . Bipolar 2 disorder, major depressive episode   . Hypertension   . High cholesterol     PAST SURGICAL HISTOIRY:   Past Surgical History  Procedure Laterality Date  . Gastric bypass      SOCIAL HISTORY:   History  Substance Use Topics  . Smoking status: Never Smoker   . Smokeless tobacco: Never Used  . Alcohol Use: Yes    FAMILY HISTORY:  No family history on file.  DRUG ALLERGIES:   Allergies  Allergen Reactions  . Betadine [Povidone Iodine] Other (See  Comments)    Reaction: Unknown   . Imitrex [Sumatriptan] Other (See Comments)    Reaction: Cold sweats   . Lidocaine Hives  . Lotrimin [Clotrimazole] Nausea And Vomiting    REVIEW OF SYSTEMS:  Review of Systems  Constitutional: Positive for malaise/fatigue. Negative for fever, chills and diaphoresis.  HENT: Negative for congestion, ear pain, hearing loss, nosebleeds and sore throat.  Eyes: Negative for blurred vision, double vision, photophobia and pain.  Respiratory: Negative for cough, sputum production, wheezing and stridor.  Cardiovascular: Negative for orthopnea, claudication and leg swelling.  Gastrointestinal: Negative for heartburn and abdominal pain.  Genitourinary: Positive for dysuria. Negative for frequency.  Musculoskeletal: Positive for back pain. Negative for joint pain and neck pain.  Skin: Negative for rash.  Neurological: Positive for weakness. Negative for tingling, sensory change, speech change, focal weakness, seizures and headaches.  Endo/Heme/Allergies: Does not bruise/bleed easily.  Psychiatric/Behavioral: Negative for memory loss. The patient is nervous/anxious.     MEDICATIONS AT HOME:   Prior to Admission medications   Medication Sig Start Date End Date Taking? Authorizing Provider  acetaminophen-codeine (TYLENOL #3) 300-30 MG per tablet Take 2 tablets by mouth every 4 (four) hours as needed for moderate pain. 01/07/15   Lavonia Drafts, MD     VITAL SIGNS:  Blood pressure 118/97, pulse 86, temperature 97.7 F (36.5 C), temperature source Oral, resp. rate 17, height 5\' 4"  (1.626 m), weight 75.751 kg (167 lb), SpO2 98 %.  PHYSICAL EXAMINATION:  GENERAL: 59 y.o.-year-old patient lying in the bed with no acute distress. Some confusion EYES:  Pupils equal, round, reactive to light and accommodation. No scleral icterus. Extraocular muscles intact.  HEENT: Head atraumatic, normocephalic. Oropharynx and  nasopharynx clear.  NECK: Supple, no jugular venous distention. No thyroid enlargement, no tenderness.  LUNGS: Normal breath sounds bilaterally, no wheezing, rales,rhonchi or crepitation. No use of accessory muscles of respiration.  CARDIOVASCULAR: S1, S2 normal. No murmurs, rubs, or gallops.  ABDOMEN: Soft, nontender, nondistended. Bowel sounds present. No organomegaly or mass.  EXTREMITIES: No pedal edema, cyanosis, or clubbing.  NEUROLOGIC: Cranial nerves II through XII are intact. Muscle strength 5/5 in all extremities. Sensation intact. Gait not checked.  PSYCHIATRIC: The patient is alert and oriented x2 anxious SKIN: No obvious rash, lesion, or ulcer.   LABORATORY PANEL:   CBC  Last Labs      Recent Labs Lab 01/10/15 1857  WBC 11.2*  HGB 9.6*  HCT 29.1*  PLT 375     ------------------------------------------------------------------------------------------------------------------  Chemistries   Last Labs      Recent Labs Lab 01/10/15 1857  NA 135  K 4.2  CL 106  CO2 18*  GLUCOSE 193*  BUN 44*  CREATININE 1.76*  CALCIUM 9.2  AST 56*  ALT 19  ALKPHOS 101  BILITOT 0.6     ------------------------------------------------------------------------------------------------------------------  Cardiac Enzymes  Last Labs      Recent Labs Lab 01/10/15 1857  TROPONINI <0.03     ------------------------------------------------------------------------------------------------------------------  RADIOLOGY:   Imaging Results (Last 48 hours)    Ct Head Wo Contrast  01/10/2015 CLINICAL DATA: Altered mental status. Unusual behavior. Urinary incontinence. EXAM: CT HEAD WITHOUT CONTRAST TECHNIQUE: Contiguous axial images were obtained from the base of the skull through the vertex without intravenous contrast. COMPARISON: None. FINDINGS: No evidence for acute infarction, hemorrhage, mass lesion, hydrocephalus, or  extra-axial fluid. Normal for age cerebral volume. Hypoattenuation of white matter, likely chronic microvascular ischemic change. Calvarium intact. No sinus or mastoid air fluid level. Negative orbits. IMPRESSION: No acute intracranial findings. Electronically Signed By: Rolla Flatten M.D. On: 01/10/2015 20:05   Dg Chest Port 1 View  01/10/2015 CLINICAL DATA: Altered mental status. Incontinence. EXAM: PORTABLE CHEST - 1 VIEW COMPARISON: None FINDINGS: Normal mediastinum and cardiac silhouette. Normal pulmonary vasculature. No evidence of effusion, infiltrate, or pneumothorax. The right humerus is riding high in the glenoid fossa suggesting superior subluxation. IMPRESSION: 1. No acute cardiopulmonary findings. 2. The right humerus appears subluxed. This could indicate chronic rotator cuff injury. Recommend clinical correlation for acute injury. aa Electronically Signed By: Suzy Bouchard M.D. On: 01/10/2015 19:48     EKG:   NSR,IncompleteLBBB IMPRESSION AND PLAN:   59 year old Mrs. Dotson with history of hypertension type 2 diabetes not on any medication history of bipolar disorder PTSD depression comes to the emergency room and found confused by her cousin. Patient was brought to the emergency room she is somewhat more awake alert and is recognizing people. She is quite anxious. She was found to have #1 altered mental status/acute encephalopathy suspected due to UTI and for all polypharmacy. We'll admit patient to medical floor. IV fluids for hydration. Follow blood culture urine culture. IV Rocephin.  #2 acute on chronic renal failure. Patient has history of C [place] stage III. Baseline creatinine around 1.12. She follows up with Dr. Smith Mince. Avoid nephrotoxins. I's and O's IV hydration Follow creatinine.  #3 depression/bipolar disorder continue home meds.  #4 Low back pain Tramadol as needed, Tylenol as needed. Avoid narcotics given confusion.  #5  hypertension Hold off BP meds since blood pressures on the  lower side.  #6 DVT prophylaxis subcutaneous heparin 3 times a day.  #7 CODE STATUS full code this was discussed with patient and patient's cousin sister who was healthcare power of attorney.    All the records are reviewed and case discussed with ED provider. Management plans discussed with the patient, family and they are in agreement.  CODE STATUS:full  TOTAL TIME TAKING CARE OF THIS PATIENT: 43minutes.    Otha Rickles M.D on 01/10/2015 at 8:48 PM  Between 7am to 6pm - Pager - [phone]  After 6pm go to [email or web address] - password [email or web address] [facility]  [facility]  Office [phone]  CC: Primary care physician; No primary care provider on file.

## 2015-01-15 NOTE — Discharge Instructions (Signed)

## 2015-02-12 ENCOUNTER — Other Ambulatory Visit: Payer: Self-pay | Admitting: Orthopedic Surgery

## 2015-02-12 DIAGNOSIS — M9943 Connective tissue stenosis of neural canal of lumbar region: Secondary | ICD-10-CM

## 2015-02-16 ENCOUNTER — Ambulatory Visit
Admission: RE | Admit: 2015-02-16 | Discharge: 2015-02-16 | Disposition: A | Discharge status: Home / Self Care | Payer: Medicare Other | Source: Ambulatory Visit | Attending: Orthopedic Surgery | Admitting: Orthopedic Surgery

## 2015-02-16 DIAGNOSIS — M9943 Connective tissue stenosis of neural canal of lumbar region: Secondary | ICD-10-CM

## 2015-02-16 DIAGNOSIS — M5126 Other intervertebral disc displacement, lumbar region: Secondary | ICD-10-CM | POA: Diagnosis not present

## 2015-02-16 DIAGNOSIS — M5416 Radiculopathy, lumbar region: Secondary | ICD-10-CM | POA: Insufficient documentation

## 2015-12-10 ENCOUNTER — Encounter: Payer: Self-pay | Admitting: Emergency Medicine

## 2015-12-10 ENCOUNTER — Emergency Department: Payer: Medicare HMO

## 2015-12-10 ENCOUNTER — Emergency Department
Admission: EM | Admit: 2015-12-10 | Discharge: 2015-12-10 | Disposition: A | Discharge status: Home / Self Care | Payer: Medicare HMO | Attending: Emergency Medicine | Admitting: Emergency Medicine

## 2015-12-10 DIAGNOSIS — Z79899 Other long term (current) drug therapy: Secondary | ICD-10-CM | POA: Diagnosis not present

## 2015-12-10 DIAGNOSIS — N189 Chronic kidney disease, unspecified: Secondary | ICD-10-CM | POA: Insufficient documentation

## 2015-12-10 DIAGNOSIS — I129 Hypertensive chronic kidney disease with stage 1 through stage 4 chronic kidney disease, or unspecified chronic kidney disease: Secondary | ICD-10-CM | POA: Diagnosis not present

## 2015-12-10 DIAGNOSIS — R531 Weakness: Secondary | ICD-10-CM | POA: Insufficient documentation

## 2015-12-10 DIAGNOSIS — Z7982 Long term (current) use of aspirin: Secondary | ICD-10-CM | POA: Diagnosis not present

## 2015-12-10 DIAGNOSIS — N3090 Cystitis, unspecified without hematuria: Secondary | ICD-10-CM | POA: Insufficient documentation

## 2015-12-10 DIAGNOSIS — F3181 Bipolar II disorder: Secondary | ICD-10-CM | POA: Diagnosis not present

## 2015-12-10 DIAGNOSIS — R55 Syncope and collapse: Secondary | ICD-10-CM | POA: Diagnosis present

## 2015-12-10 LAB — COMPREHENSIVE METABOLIC PANEL
ALBUMIN: 3.2 g/dL — AB (ref 3.5–5.0)
ALK PHOS: 85 U/L (ref 38–126)
ALT: 24 U/L (ref 14–54)
ANION GAP: 11 (ref 5–15)
AST: 39 U/L (ref 15–41)
BILIRUBIN TOTAL: 0.4 mg/dL (ref 0.3–1.2)
BUN: 30 mg/dL — ABNORMAL HIGH (ref 6–20)
CALCIUM: 9 mg/dL (ref 8.9–10.3)
CO2: 14 mmol/L — ABNORMAL LOW (ref 22–32)
CREATININE: 1.59 mg/dL — AB (ref 0.44–1.00)
Chloride: 116 mmol/L — ABNORMAL HIGH (ref 101–111)
GFR calc Af Amer: 39 mL/min — ABNORMAL LOW (ref >60)
GFR calc non Af Amer: 34 mL/min — ABNORMAL LOW (ref >60)
GLUCOSE: 163 mg/dL — AB (ref 65–99)
Potassium: 3.3 mmol/L — ABNORMAL LOW (ref 3.5–5.1)
Sodium: 141 mmol/L (ref 135–145)
TOTAL PROTEIN: 6.8 g/dL (ref 6.5–8.1)

## 2015-12-10 LAB — URINALYSIS COMPLETE WITH MICROSCOPIC (ARMC ONLY)
BILIRUBIN URINE: NEGATIVE
GLUCOSE, UA: NEGATIVE mg/dL
HGB URINE DIPSTICK: NEGATIVE
Ketones, ur: NEGATIVE mg/dL
NITRITE: NEGATIVE
Protein, ur: 30 mg/dL — AB
SPECIFIC GRAVITY, URINE: 1.012 (ref 1.005–1.030)
pH: 5 (ref 5.0–8.0)

## 2015-12-10 LAB — CBC
HCT: 31.5 % — ABNORMAL LOW (ref 35.0–47.0)
HEMOGLOBIN: 10.5 g/dL — AB (ref 12.0–16.0)
MCH: 34.3 pg — ABNORMAL HIGH (ref 26.0–34.0)
MCHC: 33.3 g/dL (ref 32.0–36.0)
MCV: 102.9 fL — ABNORMAL HIGH (ref 80.0–100.0)
PLATELETS: 119 10*3/uL — AB (ref 150–440)
RBC: 3.06 MIL/uL — ABNORMAL LOW (ref 3.80–5.20)
RDW: 14.2 % (ref 11.5–14.5)
WBC: 4.4 10*3/uL (ref 3.6–11.0)

## 2015-12-10 LAB — TROPONIN I: Troponin I: <0.03 ng/mL (ref <0.031)

## 2015-12-10 MED ORDER — CEPHALEXIN 500 MG PO CAPS: 500.0000 mg | ORAL_CAPSULE | Freq: Three times a day (TID) | ORAL | Status: DC

## 2015-12-10 MED ORDER — SODIUM CHLORIDE 0.9 % IV BOLUS (SEPSIS)
1000.0000 mL | Freq: Once | INTRAVENOUS | Status: AC
  Administered 2015-12-10: 1000 mL via INTRAVENOUS

## 2015-12-10 MED ORDER — ACETAMINOPHEN 500 MG PO TABS
1000.0000 mg | ORAL_TABLET | Freq: Once | ORAL | Status: AC
  Administered 2015-12-10: 1000 mg via ORAL
  Filled 2015-12-10: qty 2

## 2015-12-10 MED ORDER — DEXTROSE 5 % IV SOLN
1.0000 g | Freq: Once | INTRAVENOUS | Status: AC
  Administered 2015-12-10: 1 g via INTRAVENOUS
  Filled 2015-12-10: qty 10

## 2015-12-10 NOTE — Discharge Instructions (Signed)
Weakness °Weakness is a lack of strength. It may be felt all over the body (generalized) or in one specific part of the body (focal). Some causes of weakness can be serious. You may need further medical evaluation, especially if you are elderly or you have a history of immunosuppression (such as chemotherapy or HIV), kidney disease, heart disease, or diabetes. °CAUSES  °Weakness can be caused by many different things, including: °· Infection. °· Physical exhaustion. °· Internal bleeding or other blood loss that results in a lack of red blood cells (anemia). °· Dehydration. This cause is more common in elderly people. °· Side effects or electrolyte abnormalities from medicines, such as pain medicines or sedatives. °· Emotional distress, anxiety, or depression. °· Circulation problems, especially severe peripheral arterial disease. °· Heart disease, such as rapid atrial fibrillation, bradycardia, or heart failure. °· Nervous system disorders, such as Guillain-Barré syndrome, multiple sclerosis, or stroke. °DIAGNOSIS  °To find the cause of your weakness, your caregiver will take your history and perform a physical exam. Lab tests or X-rays may also be ordered, if needed. °TREATMENT  °Treatment of weakness depends on the cause of your symptoms and can vary greatly. °HOME CARE INSTRUCTIONS  °· Rest as needed. °· Eat a well-balanced diet. °· Try to get some exercise every day. °· Only take over-the-counter or prescription medicines as directed by your caregiver. °SEEK MEDICAL CARE IF:  °· Your weakness seems to be getting worse or spreads to other parts of your body. °· You develop new aches or pains. °SEEK IMMEDIATE MEDICAL CARE IF:  °· You cannot perform your normal daily activities, such as getting dressed and feeding yourself. °· You cannot walk up and down stairs, or you feel exhausted when you do so. °· You have shortness of breath or chest pain. °· You have difficulty moving parts of your body. °· You have weakness  in only one area of the body or on only one side of the body. °· You have a fever. °· You have trouble speaking or swallowing. °· You cannot control your bladder or bowel movements. °· You have black or bloody vomit or stools. °MAKE SURE YOU: °· Understand these instructions. °· Will watch your condition. °· Will get help right away if you are not doing well or get worse. °  °This information is not intended to replace advice given to you by your health care provider. Make sure you discuss any questions you have with your health care provider. °  °Document Released: 07/21/2005 Document Revised: 01/20/2012 Document Reviewed: 09/19/2011 °Elsevier Interactive Patient Education ©2016 [employer] ° °Urinary Tract Infection °Urinary tract infections (UTIs) can develop anywhere along your urinary tract. Your urinary tract is your body's drainage system for removing wastes and extra water. Your urinary tract includes two kidneys, two ureters, a bladder, and a urethra. Your kidneys are a pair of bean-shaped organs. Each kidney is about the size of your fist. They are located below your ribs, one on each side of your spine. °CAUSES °Infections are caused by microbes, which are microscopic organisms, including fungi, viruses, and bacteria. These organisms are so small that they can only be seen through a microscope. Bacteria are the microbes that most commonly cause UTIs. °SYMPTOMS  °Symptoms of UTIs may vary by age and gender of the patient and by the location of the infection. Symptoms in young women typically include a frequent and intense urge to urinate and a painful, burning feeling in the bladder or urethra during urination.   Older women and men are more likely to be tired, shaky, and weak and have muscle aches and abdominal pain. A fever may mean the infection is in your kidneys. Other symptoms of a kidney infection include pain in your back or sides below the ribs, nausea, and vomiting. °DIAGNOSIS °To diagnose a UTI,  your caregiver will ask you about your symptoms. Your caregiver will also ask you to provide a urine sample. The urine sample will be tested for bacteria and white blood cells. White blood cells are made by your body to help fight infection. °TREATMENT  °Typically, UTIs can be treated with medication. Because most UTIs are caused by a bacterial infection, they usually can be treated with the use of antibiotics. The choice of antibiotic and length of treatment depend on your symptoms and the type of bacteria causing your infection. °HOME CARE INSTRUCTIONS °· If you were prescribed antibiotics, take them exactly as your caregiver instructs you. Finish the medication even if you feel better after you have only taken some of the medication. °· Drink enough water and fluids to keep your urine clear or pale yellow. °· Avoid caffeine, tea, and carbonated beverages. They tend to irritate your bladder. °· Empty your bladder often. Avoid holding urine for long periods of time. °· Empty your bladder before and after sexual intercourse. °· After a bowel movement, women should cleanse from front to back. Use each tissue only once. °SEEK MEDICAL CARE IF:  °· You have back pain. °· You develop a fever. °· Your symptoms do not begin to resolve within 3 days. °SEEK IMMEDIATE MEDICAL CARE IF:  °· You have severe back pain or lower abdominal pain. °· You develop chills. °· You have nausea or vomiting. °· You have continued burning or discomfort with urination. °MAKE SURE YOU:  °· Understand these instructions. °· Will watch your condition. °· Will get help right away if you are not doing well or get worse. °  °This information is not intended to replace advice given to you by your health care provider. Make sure you discuss any questions you have with your health care provider. °  °Document Released: 04/30/2005 Document Revised: 04/11/2015 Document Reviewed: 08/29/2011 °Elsevier Interactive Patient Education ©2016 Elsevier Inc. ° °

## 2015-12-10 NOTE — ED Notes (Signed)
Syncopal episode x 3 over past week, woke on floor. Unknown how long she was unconscious. States trouble with word finding over past week.

## 2015-12-10 NOTE — ED Provider Notes (Signed)
[facility] Emergency Department Provider Note  ____________________________________________  Time seen: 6:30 PM  I have reviewed the triage vital signs and the nursing notes.   HISTORY  Chief Complaint Loss of Consciousness    HPI Kim Walton is a 60 y.o. female who complains of generalized weakness over the past week.She also reports multiple falls. Not sure if she is passing out he just states a 1 minute she is standing in the next minute she is falling on the floor and is unable to catch herself. Denies any serious head trauma. No preceding symptoms such as chest pain shortness of breath numbness tingling weakness headaches or vision changes. No abdominal pain or back pain. She does feel like the symptoms mimic a syndrome that she had one year ago when she was ultimately diagnosed with sepsis secondary to urinary tract infection. His dysuria frequency urgency otherwise. No fevers or chills. She's been eating and drinking normally. She has felt like sometimes she intermittently has trouble concentrating or finding the right word that she wants or getting confused about the date..     Past Medical History  Diagnosis Date  . Chronic kidney disease   . C2 cervical fracture ([place])   . PTSD (post-traumatic stress disorder)   . Femur fracture, left ([place])   . Hip fracture, left ([place])   . Depression   . Bipolar 2 disorder, major depressive episode ([place])   . Hypertension   . High cholesterol      Patient Active Problem List   Diagnosis Date Noted  . Altered mental state 01/11/2015     Past Surgical History  Procedure Laterality Date  . Gastric bypass       Current Outpatient Rx  Name  Route  Sig  Dispense  Refill  . acetaminophen (TYLENOL) 500 MG tablet   Oral   Take 1,000 mg by mouth every 6 (six) hours as needed for mild pain or headache.         Marland Kitchen aspirin 81 MG chewable tablet   Oral   Chew 81 mg by mouth at bedtime.          Marland Kitchen  atenolol (TENORMIN) 25 MG tablet   Oral   Take 25 mg by mouth 2 (two) times daily.         . B Complex-C (SUPER B COMPLEX PO)   Oral   Take 1 capsule by mouth daily.         . Biotin 10 MG CAPS   Oral   Take 10 mg by mouth daily.          . Calcium Carbonate-Vitamin D (CALCIUM 600+D) 600-400 MG-UNIT tablet   Oral   Take 1 tablet by mouth 2 (two) times daily.         . chlorthalidone (HYGROTON) 25 MG tablet   Oral   Take 12.5 mg by mouth daily.          Marland Kitchen CRANBERRY EXTRACT PO   Oral   Take 1 capsule by mouth daily.         . diphenhydrAMINE (BENADRYL) 25 MG tablet   Oral   Take 50 mg by mouth 2 (two) times daily as needed for allergies or sleep.         . Ferrous Sulfate 27 MG TABS   Oral   Take 27 mg by mouth daily with breakfast.         . lamoTRIgine (LAMICTAL) 100 MG tablet   Oral  Take 100 mg by mouth 2 (two) times daily.         Marland Kitchen lisinopril (PRINIVIL,ZESTRIL) 2.5 MG tablet   Oral   Take 2.5 mg by mouth at bedtime.         . magnesium chloride (SLOW-MAG) 64 MG TBEC SR tablet   Oral   Take 1 tablet by mouth at bedtime.         Marland Kitchen MEGARED OMEGA-3 KRILL OIL 500 MG CAPS   Oral   Take 500 mg by mouth daily.         . Melatonin 10 MG TABS   Oral   Take 10 mg by mouth at bedtime.         . Multiple Vitamin (MULTIVITAMIN WITH MINERALS) TABS tablet   Oral   Take 1 tablet by mouth daily.         . sertraline (ZOLOFT) 100 MG tablet   Oral   Take 100 mg by mouth 2 (two) times daily.          . simvastatin (ZOCOR) 20 MG tablet   Oral   Take 20 mg by mouth at bedtime.         . sodium bicarbonate 650 MG tablet   Oral   Take 650 mg by mouth 3 (three) times daily.          Marland Kitchen topiramate (TOPAMAX) 50 MG tablet   Oral   Take 50 mg by mouth 2 (two) times daily.         . cephALEXin (KEFLEX) 500 MG capsule   Oral   Take 1 capsule (500 mg total) by mouth 3 (three) times daily.   21 capsule   0       Allergies Betadine; Flexeril; Imitrex; Iodine; Lidocaine; and Lotrimin   No family history on file.  Social History Social History  Substance Use Topics  . Smoking status: Never Smoker   . Smokeless tobacco: Never Used  . Alcohol Use: Yes    Review of Systems  Constitutional:   No fever or chills. Generalized weakness Eyes:   No vision changes.  ENT:   No sore throat. No rhinorrhea. Cardiovascular:   No chest pain. Respiratory:   No dyspnea or cough. Gastrointestinal:   Negative for abdominal pain, vomiting and diarrhea.  Genitourinary:   Negative for dysuria or difficulty urinating. Musculoskeletal:   Negative for focal pain or swelling Neurological:   Negative for headaches 10-point ROS otherwise negative.  ____________________________________________   PHYSICAL EXAM:  VITAL SIGNS: ED Triage Vitals  Enc Vitals Group     BP 12/10/15 1753 141/52 mmHg     Pulse Rate 12/10/15 1753 86     Resp 12/10/15 1753 18     Temp 12/10/15 1753 97.5 F (36.4 C)     Temp Source 12/10/15 1753 Oral     SpO2 12/10/15 1753 99 %     Weight 12/10/15 1753 152 lb (68.947 kg)     Height 12/10/15 1753 5\' 6"  (1.676 m)     Head Cir --      Peak Flow --      Pain Score 12/10/15 1753 8     Pain Loc --      Pain Edu? --      Excl. in [place]? --     Vital signs reviewed, nursing assessments reviewed.   Constitutional:   Alert and oriented. Well appearing and in no distress. Eyes:   No scleral icterus. No conjunctival pallor. PERRL.  EOMI.  No nystagmus. ENT   Head:   Normocephalic and atraumatic.   Nose:   No congestion/rhinnorhea. No septal hematoma   Mouth/Throat:   MMM, no pharyngeal erythema. No peritonsillar mass.    Neck:   No stridor. No SubQ emphysema. No meningismus. Hematological/Lymphatic/Immunilogical:   No cervical lymphadenopathy. Cardiovascular:   RRR. Symmetric bilateral radial and DP pulses.  No murmurs.  Respiratory:   Normal respiratory effort without  tachypnea nor retractions. Breath sounds are clear and equal bilaterally. No wheezes/rales/rhonchi. Gastrointestinal:   Soft and nontender. Non distended. There is no CVA tenderness.  No rebound, rigidity, or guarding. Genitourinary:   deferred Musculoskeletal:   Nontender with normal range of motion in all extremities. No joint effusions.  No lower extremity tenderness.  No edema. Neurologic:   Normal speech and language.  CN 2-10 normal. Motor grossly intact. Baseline gait with cane, steady Normal finger to nose. No pronator drift. No gross focal neurologic deficits are appreciated.  Skin:    Skin is warm, dry and intact. No rash noted.  No petechiae, purpura, or bullae.  ____________________________________________    LABS (pertinent positives/negatives) (all labs ordered are listed, but only abnormal results are displayed) Labs Reviewed  CBC - Abnormal; Notable for the following:    RBC 3.06 (*)    Hemoglobin 10.5 (*)    HCT 31.5 (*)    MCV 102.9 (*)    MCH 34.3 (*)    Platelets 119 (*)    All other components within normal limits  URINALYSIS COMPLETEWITH MICROSCOPIC ([facility] ONLY) - Abnormal; Notable for the following:    Color, Urine YELLOW (*)    APPearance CLOUDY (*)    Protein, ur 30 (*)    Leukocytes, UA 3+ (*)    Bacteria, UA MANY (*)    Squamous Epithelial / LPF 0-5 (*)    All other components within normal limits  COMPREHENSIVE METABOLIC PANEL - Abnormal; Notable for the following:    Potassium 3.3 (*)    Chloride 116 (*)    CO2 14 (*)    Glucose, Bld 163 (*)    BUN 30 (*)    Creatinine, Ser 1.59 (*)    Albumin 3.2 (*)    GFR calc non Af Amer 34 (*)    GFR calc Af Amer 39 (*)    All other components within normal limits  TROPONIN I   ____________________________________________   EKG  Interpreted by me Normal sinus rhythm rate of 79, normal axis, normal intervals. Left bundle branch block, poor R-wave progression in anterior precordial leads, left  ventricular hypertrophy, normal ST segments. Isolated T-wave inversion in lead 3 which is nonspecific.  ____________________________________________    RADIOLOGY  CT head unremarkable  ____________________________________________   PROCEDURES   ____________________________________________   INITIAL IMPRESSION / ASSESSMENT AND PLAN / ED COURSE  Pertinent labs & imaging results that were available during my care of the patient were reviewed by me and considered in my medical decision making (see chart for details).  Patient presents with generalized weakness and multiple falls and some intermittent cognitive complaints. Workup is significant for urinary tract infection but otherwise unremarkable. She is currently at baseline, lucid, comfortable well-appearing with unremarkable vital signs. The symptoms potentially could reflect a mild delirium, but since this seems to have resolved at present time she is eating and drinking, she is given IV ceftriaxone and IV fluids in the emergency department will be continued on antibiotics at home with close follow-up with primary care.  No evidence of pyelonephritis or sepsis at this time. Low suspicion for stroke intracranial hemorrhage meningitis encephalitis.     ____________________________________________   FINAL CLINICAL IMPRESSION(S) / ED DIAGNOSES  Final diagnoses:  Cystitis  Generalized weakness       Portions of this note were generated with dragon dictation software. Dictation errors may occur despite best attempts at proofreading.   Carrie Mew, MD 12/10/15 2132

## 2016-07-31 ENCOUNTER — Emergency Department
Admission: EM | Admit: 2016-07-31 | Discharge: 2016-07-31 | Disposition: A | Discharge status: Home / Self Care | Payer: Medicare HMO | Attending: Emergency Medicine | Admitting: Emergency Medicine

## 2016-07-31 ENCOUNTER — Encounter: Payer: Self-pay | Admitting: Emergency Medicine

## 2016-07-31 DIAGNOSIS — N390 Urinary tract infection, site not specified: Secondary | ICD-10-CM | POA: Diagnosis not present

## 2016-07-31 DIAGNOSIS — N189 Chronic kidney disease, unspecified: Secondary | ICD-10-CM | POA: Insufficient documentation

## 2016-07-31 DIAGNOSIS — Z7982 Long term (current) use of aspirin: Secondary | ICD-10-CM | POA: Insufficient documentation

## 2016-07-31 DIAGNOSIS — I129 Hypertensive chronic kidney disease with stage 1 through stage 4 chronic kidney disease, or unspecified chronic kidney disease: Secondary | ICD-10-CM | POA: Diagnosis not present

## 2016-07-31 DIAGNOSIS — Z79899 Other long term (current) drug therapy: Secondary | ICD-10-CM | POA: Insufficient documentation

## 2016-07-31 DIAGNOSIS — M545 Low back pain: Secondary | ICD-10-CM | POA: Diagnosis present

## 2016-07-31 LAB — URINALYSIS, COMPLETE (UACMP) WITH MICROSCOPIC
BILIRUBIN URINE: NEGATIVE
GLUCOSE, UA: NEGATIVE mg/dL
KETONES UR: NEGATIVE mg/dL
Nitrite: NEGATIVE
PROTEIN: 100 mg/dL — AB
Specific Gravity, Urine: 1.017 (ref 1.005–1.030)
pH: 5 (ref 5.0–8.0)

## 2016-07-31 LAB — CBC
HCT: 34 % — ABNORMAL LOW (ref 35.0–47.0)
Hemoglobin: 11.7 g/dL — ABNORMAL LOW (ref 12.0–16.0)
MCH: 34 pg (ref 26.0–34.0)
MCHC: 34.6 g/dL (ref 32.0–36.0)
MCV: 98.5 fL (ref 80.0–100.0)
PLATELETS: 74 10*3/uL — AB (ref 150–440)
RBC: 3.45 MIL/uL — ABNORMAL LOW (ref 3.80–5.20)
RDW: 12.3 % (ref 11.5–14.5)
WBC: 8.1 10*3/uL (ref 3.6–11.0)

## 2016-07-31 LAB — BASIC METABOLIC PANEL
ANION GAP: 12 (ref 5–15)
BUN: 52 mg/dL — ABNORMAL HIGH (ref 6–20)
CALCIUM: 8.6 mg/dL — AB (ref 8.9–10.3)
CO2: 17 mmol/L — AB (ref 22–32)
CREATININE: 1.95 mg/dL — AB (ref 0.44–1.00)
Chloride: 102 mmol/L (ref 101–111)
GFR, EST AFRICAN AMERICAN: 31 mL/min — AB (ref >60)
GFR, EST NON AFRICAN AMERICAN: 26 mL/min — AB (ref >60)
GLUCOSE: 248 mg/dL — AB (ref 65–99)
Potassium: 2.8 mmol/L — ABNORMAL LOW (ref 3.5–5.1)
Sodium: 131 mmol/L — ABNORMAL LOW (ref 135–145)

## 2016-07-31 LAB — LACTIC ACID, PLASMA: Lactic Acid, Venous: 0.9 mmol/L (ref 0.5–1.9)

## 2016-07-31 MED ORDER — SODIUM CHLORIDE 0.9 % IV BOLUS (SEPSIS)
1000.0000 mL | Freq: Once | INTRAVENOUS | Status: AC
  Administered 2016-07-31: 1000 mL via INTRAVENOUS

## 2016-07-31 MED ORDER — DEXTROSE 5 % IV SOLN: 1.0000 g | Freq: Once | INTRAVENOUS | Status: DC

## 2016-07-31 MED ORDER — OXYCODONE-ACETAMINOPHEN 5-325 MG PO TABS
1.0000 | ORAL_TABLET | Freq: Once | ORAL | Status: AC
  Administered 2016-07-31: 1 via ORAL
  Filled 2016-07-31: qty 1

## 2016-07-31 MED ORDER — CEPHALEXIN 500 MG PO CAPS: 500.0000 mg | ORAL_CAPSULE | Freq: Three times a day (TID) | ORAL | 0 refills | Status: DC

## 2016-07-31 MED ORDER — CEFTRIAXONE SODIUM-DEXTROSE 1-3.74 GM-% IV SOLR
1.0000 g | Freq: Once | INTRAVENOUS | Status: AC
  Administered 2016-07-31: 1 g via INTRAVENOUS
  Filled 2016-07-31: qty 50

## 2016-07-31 MED ORDER — TRAMADOL HCL 50 MG PO TABS: 50.0000 mg | ORAL_TABLET | Freq: Four times a day (QID) | ORAL | 0 refills | Status: DC | PRN

## 2016-07-31 MED ORDER — MORPHINE SULFATE (PF) 2 MG/ML IV SOLN
INTRAVENOUS | Status: AC
  Filled 2016-07-31: qty 1

## 2016-07-31 MED ORDER — MORPHINE SULFATE (PF) 2 MG/ML IV SOLN
2.0000 mg | Freq: Once | INTRAVENOUS | Status: AC
  Administered 2016-07-31: 2 mg via INTRAVENOUS

## 2016-07-31 MED ORDER — POTASSIUM CHLORIDE CRYS ER 20 MEQ PO TBCR
40.0000 meq | EXTENDED_RELEASE_TABLET | Freq: Once | ORAL | Status: AC
  Administered 2016-07-31: 40 meq via ORAL
  Filled 2016-07-31: qty 2

## 2016-07-31 NOTE — Discharge Instructions (Signed)
Please seek medical attention for any high fevers, chest pain, shortness of breath, change in behavior, persistent vomiting, bloody stool or any other new or concerning symptoms.  

## 2016-07-31 NOTE — ED Triage Notes (Addendum)
Patient from home via [facility]. Patient reports back pain and burning with urination x4 days. Also states she has had a fever since Tuesday.  Patient reports hx of uti with similar symptoms. Patient also reports hx of renal failure but states she does make urine and is not currently on dialysis.

## 2016-07-31 NOTE — ED Notes (Signed)
Patient on phone trying to contact ride. Will discharge once fluid bolus is complete.

## 2016-07-31 NOTE — ED Provider Notes (Signed)
[facility] Emergency Department Provider Note   ____________________________________________   I have reviewed the triage vital signs and the nursing notes.   HISTORY  Chief Complaint Dysuria and Back Pain   History limited by: Not Limited   HPI Kim Walton is a 60 y.o. female who presents to the emergency department today via EMS because of concern for lower back pain, weakness, fever, incontinence and possible UTI. The patient's symptoms started four days ago when she started having low back pain. Patient has a history of degenerative disk disease so initially thought that was the cause of the pain. However two days ago the patient developed fever. She then progressed with weakness as well as some incontinence today. States she has a history of UTI. Has noticed that her urine has been more cloudy than normal.    Past Medical History:  Diagnosis Date  . Bipolar 2 disorder, major depressive episode ([place])   . C2 cervical fracture ([place])   . Chronic kidney disease   . Depression   . Femur fracture, left ([place])   . High cholesterol   . Hip fracture, left ([place])   . Hypertension   . PTSD (post-traumatic stress disorder)     Patient Active Problem List   Diagnosis Date Noted  . Altered mental state 01/11/2015    Past Surgical History:  Procedure Laterality Date  . GASTRIC BYPASS      Prior to Admission medications   Medication Sig Start Date End Date Taking? Authorizing Provider  acetaminophen (TYLENOL) 500 MG tablet Take 1,000 mg by mouth every 6 (six) hours as needed for mild pain or headache.    Historical Provider, MD  aspirin 81 MG chewable tablet Chew 81 mg by mouth at bedtime.     Historical Provider, MD  atenolol (TENORMIN) 25 MG tablet Take 25 mg by mouth 2 (two) times daily.    Historical Provider, MD  B Complex-C (SUPER B COMPLEX PO) Take 1 capsule by mouth daily.    Historical Provider, MD  Biotin 10 MG CAPS Take 10 mg by mouth  daily.     Historical Provider, MD  Calcium Carbonate-Vitamin D (CALCIUM 600+D) 600-400 MG-UNIT tablet Take 1 tablet by mouth 2 (two) times daily.    Historical Provider, MD  cephALEXin (KEFLEX) 500 MG capsule Take 1 capsule (500 mg total) by mouth 3 (three) times daily. 12/10/15   Carrie Mew, MD  chlorthalidone (HYGROTON) 25 MG tablet Take 12.5 mg by mouth daily.     Historical Provider, MD  CRANBERRY EXTRACT PO Take 1 capsule by mouth daily.    Historical Provider, MD  diphenhydrAMINE (BENADRYL) 25 MG tablet Take 50 mg by mouth 2 (two) times daily as needed for allergies or sleep.    Historical Provider, MD  Ferrous Sulfate 27 MG TABS Take 27 mg by mouth daily with breakfast.    Historical Provider, MD  lamoTRIgine (LAMICTAL) 100 MG tablet Take 100 mg by mouth 2 (two) times daily.    Historical Provider, MD  lisinopril (PRINIVIL,ZESTRIL) 2.5 MG tablet Take 2.5 mg by mouth at bedtime.    Historical Provider, MD  magnesium chloride (SLOW-MAG) 64 MG TBEC SR tablet Take 1 tablet by mouth at bedtime.    Historical Provider, MD  MEGARED OMEGA-3 KRILL OIL 500 MG CAPS Take 500 mg by mouth daily.    Historical Provider, MD  Melatonin 10 MG TABS Take 10 mg by mouth at bedtime.    Historical Provider, MD  Multiple  Vitamin (MULTIVITAMIN WITH MINERALS) TABS tablet Take 1 tablet by mouth daily.    Historical Provider, MD  sertraline (ZOLOFT) 100 MG tablet Take 100 mg by mouth 2 (two) times daily.     Historical Provider, MD  simvastatin (ZOCOR) 20 MG tablet Take 20 mg by mouth at bedtime.    Historical Provider, MD  sodium bicarbonate 650 MG tablet Take 650 mg by mouth 3 (three) times daily.     Historical Provider, MD  topiramate (TOPAMAX) 50 MG tablet Take 50 mg by mouth 2 (two) times daily.    Historical Provider, MD    Allergies Betadine [povidone iodine]; Flexeril [cyclobenzaprine]; Imitrex [sumatriptan]; Iodine; Lidocaine; and Lotrimin [clotrimazole]  No family history on file.  Social  History Social History  Substance Use Topics  . Smoking status: Never Smoker  . Smokeless tobacco: Never Used  . Alcohol use Yes    Review of Systems  Constitutional: Positive for fever. Cardiovascular: Negative for chest pain. Respiratory: Negative for shortness of breath. Gastrointestinal: Negative for abdominal pain, vomiting and diarrhea. Genitourinary: Positive for cloudy urine. Musculoskeletal: Positive for bilateral low back pain. Skin: Negative for rash. Neurological: Negative for headaches, focal weakness or numbness.  10-point ROS otherwise negative.  ____________________________________________   PHYSICAL EXAM:  VITAL SIGNS: ED Triage Vitals  Enc Vitals Group     BP 07/31/16 0852 133/63     Pulse Rate 07/31/16 0852 100     Resp 07/31/16 0852 16     Temp --      Temp src --      SpO2 07/31/16 0852 100 %     Weight 07/31/16 0849 160 lb (72.6 kg)     Height 07/31/16 0849 5\' 6"  (1.676 m)     Head Circumference --      Peak Flow --      Pain Score 07/31/16 0851 7   Constitutional: Alert and oriented. Well appearing and in no distress. Eyes: Conjunctivae are normal. Normal extraocular movements. ENT   Head: Normocephalic and atraumatic.   Nose: No congestion/rhinnorhea.   Mouth/Throat: Mucous membranes are moist.   Neck: No stridor. Hematological/Lymphatic/Immunilogical: No cervical lymphadenopathy. Cardiovascular: Normal rate, regular rhythm.  No murmurs, rubs, or gallops.  Respiratory: Normal respiratory effort without tachypnea nor retractions. Breath sounds are clear and equal bilaterally. No wheezes/rales/rhonchi. Gastrointestinal: Soft and non tender. No CVA tenderness. No rebound. No guarding.  Genitourinary: Deferred Musculoskeletal: Normal range of motion in all extremities. No lower extremity edema. Neurologic:  Normal speech and language. No gross focal neurologic deficits are appreciated.  Skin:  Skin is warm, dry and intact. No rash  noted. Psychiatric: Mood and affect are normal. Speech and behavior are normal. Patient exhibits appropriate insight and judgment.  ____________________________________________    LABS (pertinent positives/negatives)  Labs Reviewed  URINALYSIS, COMPLETE (UACMP) WITH MICROSCOPIC - Abnormal; Notable for the following:       Result Value   Color, Urine YELLOW (*)    APPearance CLOUDY (*)    Hgb urine dipstick MODERATE (*)    Protein, ur 100 (*)    Leukocytes, UA LARGE (*)    Bacteria, UA RARE (*)    Squamous Epithelial / LPF 0-5 (*)    Non Squamous Epithelial 0-5 (*)    All other components within normal limits  CBC - Abnormal; Notable for the following:    RBC 3.45 (*)    Hemoglobin 11.7 (*)    HCT 34.0 (*)    Platelets 74 (*)  All other components within normal limits  BASIC METABOLIC PANEL - Abnormal; Notable for the following:    Sodium 131 (*)    Potassium 2.8 (*)    CO2 17 (*)    Glucose, Bld 248 (*)    BUN 52 (*)    Creatinine, Ser 1.95 (*)    Calcium 8.6 (*)    GFR calc non Af Amer 26 (*)    GFR calc Af Amer 31 (*)    All other components within normal limits  URINE CULTURE  LACTIC ACID, PLASMA     ____________________________________________   EKG  None  ____________________________________________    RADIOLOGY  None  ____________________________________________   PROCEDURES  Procedures  ____________________________________________   INITIAL IMPRESSION / ASSESSMENT AND PLAN / ED COURSE  Pertinent labs & imaging results that were available during my care of the patient were reviewed by me and considered in my medical decision making (see chart for details).  Patient presented to the emergency department today because of concerns for low back pain and possible UTI. Urine is consistent with urinary tract infection. Blood work is consistent with some dehydration. Patient was given liter of fluids as well as IV antibiotics here in the  emergency department. Urine will be sent for culture. Will plan on discharging patient home with further antibiotics and prescription for pain medication.  ____________________________________________   FINAL CLINICAL IMPRESSION(S) / ED DIAGNOSES  Final diagnoses:  Lower urinary tract infectious disease     Note: This dictation was prepared with Dragon dictation. Any transcriptional errors that result from this process are unintentional     Nance Pear, MD 07/31/16 1245

## 2016-08-03 LAB — URINE CULTURE

## 2016-12-23 ENCOUNTER — Emergency Department: Payer: Medicare HMO

## 2016-12-23 ENCOUNTER — Observation Stay: Payer: Medicare HMO

## 2016-12-23 ENCOUNTER — Inpatient Hospital Stay
Admission: EM | Admit: 2016-12-23 | Discharge: 2016-12-26 | DRG: 871 | Disposition: A | Discharge status: Home / Self Care | Payer: Medicare HMO | Attending: Internal Medicine | Admitting: Internal Medicine

## 2016-12-23 ENCOUNTER — Inpatient Hospital Stay (HOSPITAL_COMMUNITY)
Admit: 2016-12-23 | Discharge: 2016-12-23 | Disposition: A | Discharge status: Home / Self Care | Payer: Medicare HMO | Attending: Internal Medicine | Admitting: Internal Medicine

## 2016-12-23 DIAGNOSIS — Z9884 Bariatric surgery status: Secondary | ICD-10-CM

## 2016-12-23 DIAGNOSIS — I639 Cerebral infarction, unspecified: Secondary | ICD-10-CM | POA: Diagnosis present

## 2016-12-23 DIAGNOSIS — N3000 Acute cystitis without hematuria: Secondary | ICD-10-CM

## 2016-12-23 DIAGNOSIS — F329 Major depressive disorder, single episode, unspecified: Secondary | ICD-10-CM | POA: Diagnosis present

## 2016-12-23 DIAGNOSIS — E872 Acidosis: Secondary | ICD-10-CM | POA: Diagnosis present

## 2016-12-23 DIAGNOSIS — E875 Hyperkalemia: Secondary | ICD-10-CM | POA: Diagnosis not present

## 2016-12-23 DIAGNOSIS — Z7982 Long term (current) use of aspirin: Secondary | ICD-10-CM | POA: Diagnosis not present

## 2016-12-23 DIAGNOSIS — Z888 Allergy status to other drugs, medicaments and biological substances status: Secondary | ICD-10-CM | POA: Diagnosis not present

## 2016-12-23 DIAGNOSIS — R6520 Severe sepsis without septic shock: Secondary | ICD-10-CM | POA: Diagnosis present

## 2016-12-23 DIAGNOSIS — N170 Acute kidney failure with tubular necrosis: Secondary | ICD-10-CM | POA: Diagnosis present

## 2016-12-23 DIAGNOSIS — E785 Hyperlipidemia, unspecified: Secondary | ICD-10-CM | POA: Diagnosis present

## 2016-12-23 DIAGNOSIS — G9341 Metabolic encephalopathy: Secondary | ICD-10-CM | POA: Diagnosis present

## 2016-12-23 DIAGNOSIS — R4182 Altered mental status, unspecified: Secondary | ICD-10-CM

## 2016-12-23 DIAGNOSIS — I361 Nonrheumatic tricuspid (valve) insufficiency: Secondary | ICD-10-CM | POA: Diagnosis not present

## 2016-12-23 DIAGNOSIS — F3181 Bipolar II disorder: Secondary | ICD-10-CM | POA: Diagnosis present

## 2016-12-23 DIAGNOSIS — N189 Chronic kidney disease, unspecified: Secondary | ICD-10-CM | POA: Diagnosis present

## 2016-12-23 DIAGNOSIS — G9340 Encephalopathy, unspecified: Secondary | ICD-10-CM | POA: Diagnosis present

## 2016-12-23 DIAGNOSIS — A419 Sepsis, unspecified organism: Secondary | ICD-10-CM | POA: Diagnosis not present

## 2016-12-23 DIAGNOSIS — F4310 Post-traumatic stress disorder, unspecified: Secondary | ICD-10-CM | POA: Diagnosis present

## 2016-12-23 DIAGNOSIS — Z79899 Other long term (current) drug therapy: Secondary | ICD-10-CM | POA: Diagnosis not present

## 2016-12-23 DIAGNOSIS — I129 Hypertensive chronic kidney disease with stage 1 through stage 4 chronic kidney disease, or unspecified chronic kidney disease: Secondary | ICD-10-CM | POA: Diagnosis present

## 2016-12-23 LAB — URINALYSIS, COMPLETE (UACMP) WITH MICROSCOPIC
BILIRUBIN URINE: NEGATIVE
Glucose, UA: NEGATIVE mg/dL
Ketones, ur: NEGATIVE mg/dL
Nitrite: NEGATIVE
PH: 5 (ref 5.0–8.0)
Protein, ur: 30 mg/dL — AB
SPECIFIC GRAVITY, URINE: 1.011 (ref 1.005–1.030)

## 2016-12-23 LAB — URINE DRUG SCREEN, QUALITATIVE (ARMC ONLY)
AMPHETAMINES, UR SCREEN: NOT DETECTED
BARBITURATES, UR SCREEN: NOT DETECTED
BENZODIAZEPINE, UR SCRN: NOT DETECTED
Cannabinoid 50 Ng, Ur ~~LOC~~: NOT DETECTED
Cocaine Metabolite,Ur ~~LOC~~: NOT DETECTED
MDMA (Ecstasy)Ur Screen: NOT DETECTED
METHADONE SCREEN, URINE: NOT DETECTED
OPIATE, UR SCREEN: NOT DETECTED
Phencyclidine (PCP) Ur S: NOT DETECTED
TRICYCLIC, UR SCREEN: NOT DETECTED

## 2016-12-23 LAB — COMPREHENSIVE METABOLIC PANEL
ALT: 22 U/L (ref 14–54)
AST: 47 U/L — ABNORMAL HIGH (ref 15–41)
Albumin: 3.8 g/dL (ref 3.5–5.0)
Alkaline Phosphatase: 134 U/L — ABNORMAL HIGH (ref 38–126)
Anion gap: 13 (ref 5–15)
BUN: 50 mg/dL — ABNORMAL HIGH (ref 6–20)
CHLORIDE: 115 mmol/L — AB (ref 101–111)
CO2: 14 mmol/L — AB (ref 22–32)
CREATININE: 1.61 mg/dL — AB (ref 0.44–1.00)
Calcium: 8.9 mg/dL (ref 8.9–10.3)
GFR calc non Af Amer: 33 mL/min — ABNORMAL LOW (ref >60)
GFR, EST AFRICAN AMERICAN: 39 mL/min — AB (ref >60)
Glucose, Bld: 144 mg/dL — ABNORMAL HIGH (ref 65–99)
Potassium: 5.4 mmol/L — ABNORMAL HIGH (ref 3.5–5.1)
SODIUM: 142 mmol/L (ref 135–145)
Total Bilirubin: 0.7 mg/dL (ref 0.3–1.2)
Total Protein: 7.5 g/dL (ref 6.5–8.1)

## 2016-12-23 LAB — CBC WITH DIFFERENTIAL/PLATELET
BASOS ABS: 0 10*3/uL (ref 0–0.1)
Basophils Relative: 0 %
EOS PCT: 2 %
Eosinophils Absolute: 0.2 10*3/uL (ref 0–0.7)
HEMATOCRIT: 34.6 % — AB (ref 35.0–47.0)
Hemoglobin: 11.6 g/dL — ABNORMAL LOW (ref 12.0–16.0)
LYMPHS ABS: 1.9 10*3/uL (ref 1.0–3.6)
Lymphocytes Relative: 23 %
MCH: 34.1 pg — AB (ref 26.0–34.0)
MCHC: 33.7 g/dL (ref 32.0–36.0)
MCV: 101.3 fL — ABNORMAL HIGH (ref 80.0–100.0)
MONO ABS: 0.5 10*3/uL (ref 0.2–0.9)
Monocytes Relative: 6 %
NEUTROS PCT: 69 %
Neutro Abs: 5.8 10*3/uL (ref 1.4–6.5)
Platelets: 256 10*3/uL (ref 150–440)
RBC: 3.41 MIL/uL — ABNORMAL LOW (ref 3.80–5.20)
RDW: 14.6 % — AB (ref 11.5–14.5)
WBC: 8.4 10*3/uL (ref 3.6–11.0)

## 2016-12-23 LAB — BRAIN NATRIURETIC PEPTIDE: B Natriuretic Peptide: 30 pg/mL (ref 0.0–100.0)

## 2016-12-23 LAB — LACTIC ACID, PLASMA
LACTIC ACID, VENOUS: 4.7 mmol/L — AB (ref 0.5–1.9)
Lactic Acid, Venous: 4.6 mmol/L (ref 0.5–1.9)

## 2016-12-23 LAB — TROPONIN I: Troponin I: <0.03 ng/mL (ref <0.03)

## 2016-12-23 MED ORDER — SERTRALINE HCL 50 MG PO TABS
100.0000 mg | ORAL_TABLET | Freq: Two times a day (BID) | ORAL | Status: DC
  Administered 2016-12-23 – 2016-12-26 (×6): 100 mg via ORAL
  Filled 2016-12-23 (×6): qty 2

## 2016-12-23 MED ORDER — CEFTRIAXONE SODIUM IN DEXTROSE 20 MG/ML IV SOLN
1.0000 g | INTRAVENOUS | Status: DC
  Filled 2016-12-23: qty 50

## 2016-12-23 MED ORDER — VANCOMYCIN HCL IN DEXTROSE 1-5 GM/200ML-% IV SOLN
1000.0000 mg | Freq: Once | INTRAVENOUS | Status: AC
  Administered 2016-12-23: 1000 mg via INTRAVENOUS
  Filled 2016-12-23: qty 200

## 2016-12-23 MED ORDER — SODIUM CHLORIDE 0.9 % IV SOLN
Freq: Once | INTRAVENOUS | Status: AC
  Administered 2016-12-23: 12:00:00 via INTRAVENOUS

## 2016-12-23 MED ORDER — PIPERACILLIN-TAZOBACTAM 3.375 G IVPB 30 MIN
3.3750 g | Freq: Once | INTRAVENOUS | Status: AC
  Administered 2016-12-23: 3.375 g via INTRAVENOUS
  Filled 2016-12-23: qty 50

## 2016-12-23 MED ORDER — CLOPIDOGREL BISULFATE 75 MG PO TABS
75.0000 mg | ORAL_TABLET | Freq: Every day | ORAL | Status: DC
  Administered 2016-12-23 – 2016-12-26 (×4): 75 mg via ORAL
  Filled 2016-12-23 (×4): qty 1

## 2016-12-23 MED ORDER — CEFTRIAXONE SODIUM IN DEXTROSE 20 MG/ML IV SOLN: 1.0000 g | Freq: Once | INTRAVENOUS | Status: DC

## 2016-12-23 MED ORDER — SODIUM CHLORIDE 0.9 % IV SOLN
INTRAVENOUS | Status: DC
  Administered 2016-12-23 – 2016-12-26 (×4): via INTRAVENOUS

## 2016-12-23 MED ORDER — CEFTRIAXONE SODIUM-DEXTROSE 1-3.74 GM-% IV SOLR: 1.0000 g | Freq: Once | INTRAVENOUS | Status: DC

## 2016-12-23 MED ORDER — ASPIRIN EC 325 MG PO TBEC
325.0000 mg | DELAYED_RELEASE_TABLET | Freq: Every day | ORAL | Status: DC
  Administered 2016-12-23: 325 mg via ORAL
  Filled 2016-12-23: qty 1

## 2016-12-23 MED ORDER — MELATONIN 5 MG PO TABS
10.0000 mg | ORAL_TABLET | Freq: Every day | ORAL | Status: DC
  Administered 2016-12-23 – 2016-12-25 (×3): 10 mg via ORAL
  Filled 2016-12-23 (×3): qty 2

## 2016-12-23 MED ORDER — DEXTROSE 5 % IV SOLN
1.0000 g | INTRAVENOUS | Status: DC
  Administered 2016-12-23: 18:00:00 1 g via INTRAVENOUS
  Filled 2016-12-23: qty 10

## 2016-12-23 MED ORDER — ONDANSETRON HCL 4 MG/2ML IJ SOLN
4.0000 mg | Freq: Four times a day (QID) | INTRAMUSCULAR | Status: DC | PRN
  Administered 2016-12-23 – 2016-12-25 (×5): 4 mg via INTRAVENOUS
  Filled 2016-12-23 (×5): qty 2

## 2016-12-23 MED ORDER — ATORVASTATIN CALCIUM 20 MG PO TABS
40.0000 mg | ORAL_TABLET | Freq: Every day | ORAL | Status: DC
  Administered 2016-12-23 – 2016-12-25 (×3): 40 mg via ORAL
  Filled 2016-12-23 (×4): qty 2

## 2016-12-23 MED ORDER — ASPIRIN 300 MG RE SUPP: 300.0000 mg | Freq: Every day | RECTAL | Status: DC

## 2016-12-23 MED ORDER — ADULT MULTIVITAMIN W/MINERALS CH
1.0000 | ORAL_TABLET | Freq: Every day | ORAL | Status: DC
  Administered 2016-12-24 – 2016-12-26 (×3): 1 via ORAL
  Filled 2016-12-23 (×3): qty 1

## 2016-12-23 MED ORDER — TOPIRAMATE 25 MG PO TABS
50.0000 mg | ORAL_TABLET | Freq: Two times a day (BID) | ORAL | Status: DC
  Administered 2016-12-23 – 2016-12-26 (×6): 50 mg via ORAL
  Filled 2016-12-23 (×8): qty 2

## 2016-12-23 MED ORDER — STROKE: EARLY STAGES OF RECOVERY BOOK
Freq: Once | Status: AC
  Administered 2016-12-23: 16:00:00

## 2016-12-23 MED ORDER — LAMOTRIGINE 100 MG PO TABS
100.0000 mg | ORAL_TABLET | Freq: Two times a day (BID) | ORAL | Status: DC
  Administered 2016-12-23 – 2016-12-26 (×6): 100 mg via ORAL
  Filled 2016-12-23 (×6): qty 1

## 2016-12-23 MED ORDER — SODIUM CHLORIDE 0.9 % IV SOLN
Freq: Once | INTRAVENOUS | Status: AC
  Administered 2016-12-23: 13:00:00 via INTRAVENOUS

## 2016-12-23 MED ORDER — ACETAMINOPHEN 500 MG PO TABS
1000.0000 mg | ORAL_TABLET | Freq: Four times a day (QID) | ORAL | Status: DC | PRN
  Administered 2016-12-23 – 2016-12-26 (×4): 1000 mg via ORAL
  Filled 2016-12-23 (×4): qty 2

## 2016-12-23 NOTE — ED Notes (Signed)
Pt arrived via ems for main c/o altered mental status - pt is oriented to person only and responds to questions with her name only - her responses are not appropriate for questions and she laughs at inappropriate times - pt denies any pain - pt assessed by Dr Jimmye Norman at this time

## 2016-12-23 NOTE — ED Provider Notes (Addendum)
[facility] Emergency Department Provider Note       Time seen: ----------------------------------------- 12:05 PM on 12/23/2016 -----------------------------------------  L5 caveat: Review of systems and history is limited by altered mental status.  I have reviewed the triage vital signs and the nursing notes.   HISTORY   Chief Complaint Altered Mental Status    HPI Kim Walton is a 61 y.o. female who presents to the ED for altered mental status. Patient responds to questions with her name only and laughs inappropriately. She denies any pain, reportedly a friend checked on her and found her altered and referred her to the ER for evaluation via EMS.   Past Medical History:  Diagnosis Date  . Bipolar 2 disorder, major depressive episode ([place])   . C2 cervical fracture ([place])   . Chronic kidney disease   . Depression   . Femur fracture, left ([place])   . High cholesterol   . Hip fracture, left ([place])   . Hypertension   . PTSD (post-traumatic stress disorder)     Patient Active Problem List   Diagnosis Date Noted  . Altered mental state 01/11/2015    Past Surgical History:  Procedure Laterality Date  . GASTRIC BYPASS      Allergies Betadine [povidone iodine]; Flexeril [cyclobenzaprine]; Imitrex [sumatriptan]; Iodine; Lidocaine; and Lotrimin [clotrimazole]  Social History Social History  Substance Use Topics  . Smoking status: Never Smoker  . Smokeless tobacco: Never Used  . Alcohol use Yes    Review of Systems Unknown, positive for altered mental status  All systems negative/normal/unremarkable except as stated in the HPI  ____________________________________________   PHYSICAL EXAM:  VITAL SIGNS: ED Triage Vitals  Enc Vitals Group     BP 12/23/16 1153 (!) 143/72     Pulse Rate 12/23/16 1153 (!) 113     Resp 12/23/16 1153 18     Temp 12/23/16 1153 98.6 F (37 C)     Temp Source 12/23/16 1153 Oral     SpO2 12/23/16 1149  97 %     Weight 12/23/16 1154 167 lb (75.8 kg)     Height 12/23/16 1154 5\' 4"  (1.626 m)     Head Circumference --      Peak Flow --      Pain Score 12/23/16 1151 0     Pain Loc --      Pain Edu? --      Excl. in [place]? --     Constitutional: Alert But disoriented, mild distress Eyes: Conjunctivae are normal. Normal extraocular movements. ENT   Head: Normocephalic and atraumatic.   Nose: No congestion/rhinnorhea.   Mouth/Throat: Mucous membranes are moist.   Neck: No stridor. Cardiovascular: Rapid rate, regular rhythm. No murmurs, rubs, or gallops. Respiratory: Normal respiratory effort without tachypnea nor retractions. Breath sounds are clear and equal bilaterally. No wheezes/rales/rhonchi. Gastrointestinal: Soft and nontender. Normal bowel sounds Musculoskeletal: Nontender with normal range of motion in extremities. No lower extremity tenderness nor edema. Neurologic:  Normal speech and language. No gross focal neurologic deficits are appreciated.  Skin:  Skin is warm, dry and intact. No rash noted. Psychiatric: Bizarre mood and affect at times ____________________________________________  EKG: Interpreted by me. Sinus tachycardia with rate 109 bpm, normal PR interval, wide QRS, normal QT  ____________________________________________  ED COURSE:  Pertinent labs & imaging results that were available during my care of the patient were reviewed by me and considered in my medical decision making (see chart for details). Patient presents for  altered mental status, we will assess with labs and imaging as indicated.   Procedures ____________________________________________   LABS (pertinent positives/negatives)  Labs Reviewed  COMPREHENSIVE METABOLIC PANEL - Abnormal; Notable for the following:       Result Value   Potassium 5.4 (*)    Chloride 115 (*)    CO2 14 (*)    Glucose, Bld 144 (*)    BUN 50 (*)    Creatinine, Ser 1.61 (*)    AST 47 (*)    Alkaline  Phosphatase 134 (*)    GFR calc non Af Amer 33 (*)    GFR calc Af Amer 39 (*)    All other components within normal limits  LACTIC ACID, PLASMA - Abnormal; Notable for the following:    Lactic Acid, Venous 4.7 (*)    All other components within normal limits  CBC WITH DIFFERENTIAL/PLATELET - Abnormal; Notable for the following:    RBC 3.41 (*)    Hemoglobin 11.6 (*)    HCT 34.6 (*)    MCV 101.3 (*)    MCH 34.1 (*)    RDW 14.6 (*)    All other components within normal limits  URINALYSIS, COMPLETE (UACMP) WITH MICROSCOPIC - Abnormal; Notable for the following:    Color, Urine YELLOW (*)    APPearance HAZY (*)    Hgb urine dipstick SMALL (*)    Protein, ur 30 (*)    Leukocytes, UA MODERATE (*)    Bacteria, UA MANY (*)    Squamous Epithelial / LPF 0-5 (*)    All other components within normal limits  URINE CULTURE  CULTURE, BLOOD (ROUTINE X 2)  CULTURE, BLOOD (ROUTINE X 2)  TROPONIN I  BRAIN NATRIURETIC PEPTIDE  LACTIC ACID, PLASMA  URINE DRUG SCREEN, QUALITATIVE ([facility] ONLY)   CRITICAL CARE Performed by: Earleen Newport   Total critical care time: 30 minutes  Critical care time was exclusive of separately billable procedures and treating other patients.  Critical care was necessary to treat or prevent imminent or life-threatening deterioration.  Critical care was time spent personally by me on the following activities: development of treatment plan with patient and/or surrogate as well as nursing, discussions with consultants, evaluation of patient's response to treatment, examination of patient, obtaining history from patient or surrogate, ordering and performing treatments and interventions, ordering and review of laboratory studies, ordering and review of radiographic studies, pulse oximetry and re-evaluation of patient's condition.  RADIOLOGY Images were viewed by me  Chest x-ray, CT head Did not reveal any acute  process ____________________________________________  FINAL ASSESSMENT AND PLAN  Altered mental status, Sepsis, chronic kidney disease  Plan: Patient's labs and imaging were dictated above. Patient had presented for altered mental status likely secondary to sepsis with the source being a urinary tract infection. She's been started on broad-spectrum antibiotics as well as given IV fluids. Lactic acid is markedly elevated and she will be resuscitated accordingly. Blood pressure at this time is normal. I will discuss with the hospitalist for admission.   Earleen Newport, MD   Note: This note was generated in part or whole with voice recognition software. Voice recognition is usually quite accurate but there are transcription errors that can and very often do occur. I apologize for any typographical errors that were not detected and corrected.     Earleen Newport, MD 12/23/16 1251    Earleen Newport, MD 12/23/16 [phone]

## 2016-12-23 NOTE — ED Notes (Signed)
Pt assisted on bedpan to void - diaper was also wet so pt dried and changed

## 2016-12-23 NOTE — H&P (Signed)
[place] [facility] [place] NAME: Kim Walton    MR#:  [id number]  DATE OF BIRTH:  Jun 24, 1956  DATE OF ADMISSION:  12/23/2016  PRIMARY CARE PHYSICIAN: Tera Helper, MD   REQUESTING/REFERRING PHYSICIAN: Dr. Lenise Arena  CHIEF COMPLAINT:   Chief Complaint  Patient presents with  . Altered Mental Status    HISTORY OF PRESENT ILLNESS:  Kim Walton  is a 61 y.o. female came in with altered mental status.  Patient is unable to give me any history whatsoever. She just keeps on repeating "I am so sorry". The only thing the patient was able to tell me is that she has hypertension.  The patient is having trouble getting her words out. Some trouble swallowing. In the ER she was found to have a urinary tract infection and lactic acidosis.  PAST MEDICAL HISTORY:   Past Medical History:  Diagnosis Date  . Bipolar 2 disorder, major depressive episode (Kim Walton)   . C2 cervical fracture ([place])   . Chronic kidney disease   . Depression   . Femur fracture, left ([place])   . High cholesterol   . Hip fracture, left ([place])   . Hypertension   . PTSD (post-traumatic stress disorder)     PAST SURGICAL HISTORY:   Past Surgical History:  Procedure Laterality Date  . GASTRIC BYPASS      SOCIAL HISTORY:   Social History  Substance Use Topics  . Smoking status: Never Smoker  . Smokeless tobacco: Never Used  . Alcohol use Yes    FAMILY HISTORY:   Family History  Problem Relation Age of Onset  . Family history unknown: Yes   Patient unable to give me family history secondary to altered mental status.  DRUG ALLERGIES:   Allergies  Allergen Reactions  . Betadine [Povidone Iodine] Hives  . Flexeril [Cyclobenzaprine] Other (See Comments)    Reaction:  Drops blood pressure   . Imitrex [Sumatriptan] Other (See Comments)    Reaction:  Cold sweats   . Iodine Hives  . Lidocaine Hives  . Lotrimin [Clotrimazole] Nausea And Vomiting     REVIEW OF SYSTEMS:  Patient unable to get her words out. Unable to provide review of systems at this time secondary to acute encephalopathy.  MEDICATIONS AT HOME:   Prior to Admission medications   Medication Sig Start Date End Date Taking? Authorizing Provider  acetaminophen (TYLENOL) 500 MG tablet Take 1,000 mg by mouth every 6 (six) hours as needed for mild pain or headache.    [provider]  aspirin 81 MG chewable tablet Chew 81 mg by mouth at bedtime.     [provider]  atenolol (TENORMIN) 25 MG tablet Take 25 mg by mouth 2 (two) times daily.    [provider]  B Complex-C (SUPER B COMPLEX PO) Take 1 capsule by mouth daily.    [provider]  Biotin 10 MG CAPS Take 10 mg by mouth daily.     [provider]  Calcium Carbonate-Vitamin D (CALCIUM 600+D) 600-400 MG-UNIT tablet Take 1 tablet by mouth 2 (two) times daily.    [provider]  chlorthalidone (HYGROTON) 25 MG tablet Take 12.5 mg by mouth daily.     [provider]  CRANBERRY EXTRACT PO Take 1 capsule by mouth daily.    [provider]  diphenhydrAMINE (BENADRYL) 25 MG tablet Take 50 mg by mouth 2 (two) times daily as needed for allergies or sleep.  [provider]  Ferrous Sulfate 27 MG TABS Take 27 mg by mouth daily with breakfast.    [provider]  lamoTRIgine (LAMICTAL) 100 MG tablet Take 100 mg by mouth 2 (two) times daily.    [provider]  lisinopril (PRINIVIL,ZESTRIL) 2.5 MG tablet Take 2.5 mg by mouth at bedtime.    [provider]  magnesium chloride (SLOW-MAG) 64 MG TBEC SR tablet Take 1 tablet by mouth at bedtime.    [provider]  MEGARED OMEGA-3 KRILL OIL 500 MG CAPS Take 500 mg by mouth daily.    [provider]  Melatonin 10 MG TABS Take 10 mg by mouth at bedtime.    [provider]  Multiple Vitamin (MULTIVITAMIN WITH MINERALS) TABS tablet Take 1 tablet by mouth  daily.    [provider]  sertraline (ZOLOFT) 100 MG tablet Take 100 mg by mouth 2 (two) times daily.     [provider]  simvastatin (ZOCOR) 20 MG tablet Take 20 mg by mouth at bedtime.    [provider]  sodium bicarbonate 650 MG tablet Take 650 mg by mouth 3 (three) times daily.     [provider]  topiramate (TOPAMAX) 50 MG tablet Take 50 mg by mouth 2 (two) times daily.    [provider]  traMADol (ULTRAM) 50 MG tablet Take 1 tablet (50 mg total) by mouth every 6 (six) hours as needed. 07/31/16 07/31/17  Nance Pear, MD      VITAL SIGNS:  Blood pressure 138/76, pulse (!) 105, temperature 98.6 F (37 C), temperature source Oral, resp. rate 18, height 5\' 4"  (1.626 m), weight 75.8 kg (167 lb), SpO2 97 %.  PHYSICAL EXAMINATION:  GENERAL:  61 y.o.-year-old patient lying in the bed with no acute distress.  EYES: Pupils equal, round, reactive to light and accommodation. No scleral icterus. Extraocular muscles intact.  HEENT: Head atraumatic, normocephalic. Oropharynx and nasopharynx clear.  NECK:  Supple, no jugular venous distention. No thyroid enlargement, no tenderness.  LUNGS: Normal breath sounds bilaterally, no wheezing, rales,rhonchi or crepitation. No use of accessory muscles of respiration.  CARDIOVASCULAR: S1, S2 Tachycardia. No murmurs, rubs, or gallops.  ABDOMEN: Soft, nontender, nondistended. Bowel sounds present. No organomegaly or mass.  EXTREMITIES: No pedal edema, cyanosis, or clubbing.  NEUROLOGIC: Patient unable to get her words out as she would like. She is able to understand me and follow commands. Power 5 out of 5 bilateral upper and lower extremities PSYCHIATRIC: The patient is alert and tries to talk.  SKIN: No rash, lesion, or ulcer.   LABORATORY PANEL:   CBC  Recent Labs Lab 12/23/16 1157  WBC 8.4  HGB 11.6*  HCT 34.6*  PLT 256    ------------------------------------------------------------------------------------------------------------------  Chemistries   Recent Labs Lab 12/23/16 1157  NA 142  K 5.4*  CL 115*  CO2 14*  GLUCOSE 144*  BUN 50*  CREATININE 1.61*  CALCIUM 8.9  AST 47*  ALT 22  ALKPHOS 134*  BILITOT 0.7   ------------------------------------------------------------------------------------------------------------------  Cardiac Enzymes  Recent Labs Lab 12/23/16 1157  TROPONINI <0.03   ------------------------------------------------------------------------------------------------------------------  RADIOLOGY:  Ct Head Wo Contrast  Result Date: 12/23/2016 CLINICAL DATA:  61 year old female with altered mental status. Initial encounter. EXAM: CT HEAD WITHOUT CONTRAST TECHNIQUE: Contiguous axial images were obtained from the base of the skull through the vertex without intravenous contrast. COMPARISON:  12/09/2009 CT. FINDINGS: Brain: Artifact from earrings. No intracranial hemorrhage or CT evidence of large acute infarct. No  hydrocephalus or age advanced atrophy. No intracranial mass lesion noted on this unenhanced exam. Vascular: Vascular calcifications. Skull: Hyperostosis frontalis interna incidentally noted. Sinuses/Orbits: No acute orbital abnormality. Orbits not entirely imaged. Partial opacification mid left ethmoid sinus air cell. Other: Visualized mastoid air cells and middle ear cavities are clear. IMPRESSION: Streak artifact from earrings. No intracranial hemorrhage or CT evidence of large acute infarct. No intracranial mass lesion noted on this unenhanced exam. Opacification mid ethmoid sinus air cell. Electronically Signed   By: Genia Del M.D.   On: 12/23/2016 13:05   Dg Chest Portable 1 View  Result Date: 12/23/2016 CLINICAL DATA:  Altered mental status EXAM: PORTABLE CHEST 1 VIEW COMPARISON:  01/10/2015 FINDINGS: Cardiac shadow is within normal limits. The lungs are  well-aerated without focal infiltrate or sizable effusion. No bony abnormality is noted. IMPRESSION: No acute abnormality seen. Electronically Signed   By: Inez Catalina M.D.   On: 12/23/2016 12:15    EKG:   Sinus tachycardia 109 bpm, intraventricular conduction delay, flipped T waves laterally  IMPRESSION AND PLAN:   1. Acute encephalopathy with difficulty with speech. CT scan of the head was negative. I will do a stroke workup. Have the nurse do a swallow evaluation. Rectal or oral aspirin. MRI brain carotid ultrasound and echocardiogram. Physical therapy and occupational therapy consultations. This could also be secondary to acute cystitis.  Looks like the patient has had prior admissions for altered mental status secondary to pain medications. Hold off on her tramadol this point. 2. Acute cystitis with hematuria. Lactic acidosis. Send off urine culture. Empiric Rocephin. 3. Essential hypertension. Hold antihypertensives at this time and give IV fluid hydration. 4. History of bipolar disorder. Continue psychiatric medications if able to give. 5. Chronic kidney disease stage III. Monitor with IV fluid hydration 6. Hyperkalemia. Give IV fluid hydration 7. Hyperlipidemia unspecified check a lipid profile in the a.m. Switch Zocor over to Lipitor.   All the records are reviewed and case discussed with ED provider. Management plans discussed with the patient, and she is in agreement.  I was unable to reach a friend that was listed as a contact in the computer.  CODE STATUS: Full code  TOTAL TIME TAKING CARE OF THIS PATIENT: 50 minutes.    Loletha Grayer M.D on 12/23/2016 at 1:34 PM  Between 7am to 6pm - Pager - [phone]  After 6pm call admission pager [phone]  [facility]  [phone]  CC: Primary care physician; Tera Helper, MD

## 2016-12-23 NOTE — ED Notes (Signed)
Patient transported to Ultrasound 

## 2016-12-23 NOTE — Progress Notes (Signed)
Patient ID: Kim Walton, female   DOB: [birth year], 61 y.o.   MRN: [id number]  MRI showing stroke versus motion artifact. Changed inpatient. Changed to Plavix. Patient received aspirin in the emergency room. Neuro consult for tomorrow morning. Zofran ordered for nausea  Dr. Loletha Grayer

## 2016-12-23 NOTE — ED Notes (Signed)
Helped nurse place patient on bed pan

## 2016-12-23 NOTE — ED Notes (Signed)
Dr Jimmye Norman notified of lactic acid of 4.7 - no new orders given at this time

## 2016-12-23 NOTE — Progress Notes (Signed)
Pharmacy Antibiotic Note  Marliss Czar Katryna Tschirhart is a 61 y.o. female admitted on 12/23/2016 with UTI.  Pharmacy has been consulted for ceftriaxone dosing. Pt has AMS and elevated LA at 4.7. Patient received piperacillin/tazo and vancomycin x 1 in ED  Plan: Begin ceftriaxone 1 g IV q 24 hours at 1800  Height: 5\' 4"  (162.6 cm) Weight: 167 lb (75.8 kg) IBW/kg (Calculated) : 54.7  Temp (24hrs), Avg:98.5 F (36.9 C), Min:98.4 F (36.9 C), Max:98.6 F (37 C)   Recent Labs Lab 12/23/16 1157  WBC 8.4  CREATININE 1.61*  LATICACIDVEN 4.7*    Estimated Creatinine Clearance: 36.1 mL/min (A) (by C-G formula based on SCr of 1.61 mg/dL (H)).    Allergies  Allergen Reactions  . Betadine [Povidone Iodine] Hives  . Flexeril [Cyclobenzaprine] Other (See Comments)    Reaction:  Drops blood pressure   . Imitrex [Sumatriptan] Other (See Comments)    Reaction:  Cold sweats   . Iodine Hives  . Lidocaine Hives  . Lotrimin [Clotrimazole] Nausea And Vomiting    Antimicrobials this admission: Vanc/Zosyn 5/22 x 1 Ceftriaxone 5/22 >>  Dose adjustments this admission:  Microbiology results: 5/22 BCx: sent 5/22 UCx: sent   Sputum:    MRSA PCR:   Thank you for allowing pharmacy to be a part of this patient's care.  Darrow Bussing, PharmD Pharmacy Resident 12/23/2016 2:00 PM

## 2016-12-23 NOTE — Progress Notes (Signed)
OT Cancellation Note  Patient Details Name: Kim Walton MRN: [id number] DOB: March 11, 1956   Cancelled Treatment:    Reason Eval/Treat Not Completed: Medical issues which prohibited therapy. Order received chart reviewed. Pt very recently admitted to floor from ED. Potassium currently 5.4, lactic acid venous 4.7. MRI, Echo pending. Will hold OT evaluation at this time and continue to monitor for appropriateness for re-attempt of OT evaluation at later date/time.  Jeni Salles, MPH, MS, OTR/L ascom [phone] 12/23/16, 2:59 PM

## 2016-12-23 NOTE — ED Triage Notes (Signed)
Pt arrived via ems for main c/o altered mental status - pt is oriented to person only and responds to questions with her name only - her responses are not appropriate for questions and she laughs at inappropriate times - pt denies any pain

## 2016-12-23 NOTE — ED Notes (Signed)
Pt continues to be only oriented to person - responds to questions but answers are inappropriate - pt does follow commands without difficulty

## 2016-12-24 DIAGNOSIS — I639 Cerebral infarction, unspecified: Secondary | ICD-10-CM

## 2016-12-24 DIAGNOSIS — G9341 Metabolic encephalopathy: Secondary | ICD-10-CM

## 2016-12-24 LAB — LIPID PANEL
CHOL/HDL RATIO: 2.1 ratio
Cholesterol: 105 mg/dL (ref 0–200)
HDL: 49 mg/dL (ref >40)
LDL CALC: 18 mg/dL (ref 0–99)
TRIGLYCERIDES: 188 mg/dL — AB (ref <150)
VLDL: 38 mg/dL (ref 0–40)

## 2016-12-24 LAB — BLOOD CULTURE ID PANEL (REFLEXED)
ACINETOBACTER BAUMANNII: NOT DETECTED
CANDIDA ALBICANS: NOT DETECTED
CANDIDA GLABRATA: NOT DETECTED
Candida krusei: NOT DETECTED
Candida parapsilosis: NOT DETECTED
Candida tropicalis: NOT DETECTED
ENTEROBACTER CLOACAE COMPLEX: NOT DETECTED
ENTEROBACTERIACEAE SPECIES: DETECTED — AB
ENTEROCOCCUS SPECIES: NOT DETECTED
Escherichia coli: DETECTED — AB
HAEMOPHILUS INFLUENZAE: NOT DETECTED
KLEBSIELLA PNEUMONIAE: NOT DETECTED
Klebsiella oxytoca: NOT DETECTED
LISTERIA MONOCYTOGENES: NOT DETECTED
NEISSERIA MENINGITIDIS: NOT DETECTED
PSEUDOMONAS AERUGINOSA: NOT DETECTED
Proteus species: NOT DETECTED
STREPTOCOCCUS AGALACTIAE: NOT DETECTED
STREPTOCOCCUS PNEUMONIAE: NOT DETECTED
STREPTOCOCCUS SPECIES: NOT DETECTED
Serratia marcescens: NOT DETECTED
Staphylococcus aureus (BCID): NOT DETECTED
Staphylococcus species: NOT DETECTED
Streptococcus pyogenes: NOT DETECTED

## 2016-12-24 LAB — BASIC METABOLIC PANEL
Anion gap: 8 (ref 5–15)
BUN: 29 mg/dL — AB (ref 6–20)
CO2: 19 mmol/L — ABNORMAL LOW (ref 22–32)
CREATININE: 1.31 mg/dL — AB (ref 0.44–1.00)
Calcium: 8.1 mg/dL — ABNORMAL LOW (ref 8.9–10.3)
Chloride: 113 mmol/L — ABNORMAL HIGH (ref 101–111)
GFR calc Af Amer: 49 mL/min — ABNORMAL LOW (ref >60)
GFR, EST NON AFRICAN AMERICAN: 43 mL/min — AB (ref >60)
GLUCOSE: 140 mg/dL — AB (ref 65–99)
POTASSIUM: 4.2 mmol/L (ref 3.5–5.1)
SODIUM: 140 mmol/L (ref 135–145)

## 2016-12-24 LAB — CBC
HEMATOCRIT: 28.7 % — AB (ref 35.0–47.0)
Hemoglobin: 9.7 g/dL — ABNORMAL LOW (ref 12.0–16.0)
MCH: 34.3 pg — ABNORMAL HIGH (ref 26.0–34.0)
MCHC: 33.9 g/dL (ref 32.0–36.0)
MCV: 101.1 fL — ABNORMAL HIGH (ref 80.0–100.0)
PLATELETS: 144 10*3/uL — AB (ref 150–440)
RBC: 2.84 MIL/uL — ABNORMAL LOW (ref 3.80–5.20)
RDW: 14.2 % (ref 11.5–14.5)
WBC: 9.1 10*3/uL (ref 3.6–11.0)

## 2016-12-24 LAB — LAMOTRIGINE LEVEL: Lamotrigine Lvl: NOT DETECTED ug/mL (ref 2.0–20.0)

## 2016-12-24 LAB — ECHOCARDIOGRAM COMPLETE
Height: 64 in
Weight: 2672 oz

## 2016-12-24 MED ORDER — SODIUM CHLORIDE 0.9 % IV SOLN
1.0000 g | Freq: Two times a day (BID) | INTRAVENOUS | Status: DC
  Administered 2016-12-24 – 2016-12-26 (×6): 1 g via INTRAVENOUS
  Filled 2016-12-24 (×8): qty 1

## 2016-12-24 MED ORDER — TRAMADOL HCL 50 MG PO TABS
50.0000 mg | ORAL_TABLET | Freq: Four times a day (QID) | ORAL | Status: DC | PRN
  Administered 2016-12-24 – 2016-12-26 (×6): 50 mg via ORAL
  Filled 2016-12-24 (×6): qty 1

## 2016-12-24 MED ORDER — ENOXAPARIN SODIUM 40 MG/0.4ML ~~LOC~~ SOLN
40.0000 mg | SUBCUTANEOUS | Status: DC
  Administered 2016-12-24 – 2016-12-25 (×2): 40 mg via SUBCUTANEOUS
  Filled 2016-12-24 (×2): qty 0.4

## 2016-12-24 MED ORDER — PROCHLORPERAZINE MALEATE 5 MG PO TABS
5.0000 mg | ORAL_TABLET | Freq: Four times a day (QID) | ORAL | Status: DC | PRN
  Administered 2016-12-24 (×2): 5 mg via ORAL
  Filled 2016-12-24 (×3): qty 1

## 2016-12-24 MED ORDER — MORPHINE SULFATE (PF) 2 MG/ML IV SOLN
2.0000 mg | INTRAVENOUS | Status: AC
  Administered 2016-12-24: 2 mg via INTRAVENOUS
  Filled 2016-12-24: qty 1

## 2016-12-24 MED ORDER — ATENOLOL 25 MG PO TABS
25.0000 mg | ORAL_TABLET | Freq: Two times a day (BID) | ORAL | Status: DC
  Administered 2016-12-24 – 2016-12-26 (×5): 25 mg via ORAL
  Filled 2016-12-24 (×5): qty 1

## 2016-12-24 MED ORDER — LISINOPRIL 5 MG PO TABS
2.5000 mg | ORAL_TABLET | Freq: Every day | ORAL | Status: DC
  Administered 2016-12-24 – 2016-12-26 (×3): 2.5 mg via ORAL
  Filled 2016-12-24 (×3): qty 1

## 2016-12-24 NOTE — Evaluation (Signed)
Occupational Therapy Evaluation Patient Details Name: Kim Walton MRN: [id number] DOB: [birth year] Today's Date: 12/24/2016    History of Present Illness Leigh Lovelyn Sheeran is a 61 y.o. right hand dominant female who arrived from home to ED with altered mental status. In the ER she was found to have a urinary tract infection and lactic acidosis. Patient reports history of migranes, and apahasia.    Clinical Impression   Pt seen for OT evaluation this date. Pt was independent at baseline, prefers to sleep in her recliner, using a SPC for ambulation and endorsing "a few" falls due to her leg length difference (LLE shorter by approx 1 inch than RLE) and not wearing shoes in the house. Pt educated in proper footwear for falls prevention and pt verbalized plan to start wearing shoes. Pt presents with impaired activity tolerance for self care tasks, putting her at increased risk of falls. Pt will benefit from skilled OT services to address noted impairments and functional deficits to maximize return to PLOF and minimize future falls risk, risk of injury, and rehospitalization. Recommend HHOT following hospitalization.    Follow Up Recommendations  Home health OT    Equipment Recommendations  None recommended by OT    Recommendations for Other Services       Precautions / Restrictions Precautions Precautions: Fall Restrictions Weight Bearing Restrictions: No      Mobility Bed Mobility Overal bed mobility: Independent             General bed mobility comments: Patient independent for supine to sitting  Transfers Overall transfer level: Needs assistance Equipment used: None Transfers: Sit to/from Stand Sit to Stand: Min guard         General transfer comment: min guard for safety, no LOB    Balance Overall balance assessment: Needs assistance Sitting-balance support: Feet unsupported Sitting balance-Leahy Scale: Good Sitting balance - Comments: Patient able to  balance without use of hands/feet for balance   Standing balance support: No upper extremity supported Standing balance-Leahy Scale: Good                 ADL either performed or assessed with clinical judgement   ADL Overall ADL's : Needs assistance/impaired Eating/Feeding: Independent   Grooming: Supervision/safety;Standing   Upper Body Bathing: Sitting;Set up   Lower Body Bathing: Sitting/lateral leans;Sit to/from stand;Supervison/ safety   Upper Body Dressing : Sitting;Set up   Lower Body Dressing: Modified independent;Sitting/lateral leans Lower Body Dressing Details (indicate cue type and reason): pt educated in adaptive strategies for LB dressing to minimize need to bend over lower falls risk, pt verbalized understanding; pt educated in appropriate footwear to minimize falls risk and pt verbalized plan to wear supportive shoes in the home   Toilet Transfer Details (indicate cue type and reason): deferred due to pt's headache           General ADL Comments: pt generally supervision level for LB bathing during transitional movements for safety due to poor activity tolerance      Vision Baseline Vision/History: Wears glasses Wears Glasses: Reading only Patient Visual Report: No change from baseline Vision Assessment?: No apparent visual deficits     Perception     Praxis      Pertinent Vitals/Pain Pain Assessment: 0-10 Pain Score: 6  Pain Location: Head Pain Descriptors / Indicators: Aching;Headache Pain Intervention(s): Limited activity within patient's tolerance;Monitored during session     Hand Dominance Right   Extremity/Trunk Assessment Upper Extremity Assessment Upper Extremity Assessment: Overall WFL for  tasks assessed (old R rotator cuff injury, R shoulder flexion 3+/5, all else 5/5 bilaterally; pt reports this does not impact her functional use)   Lower Extremity Assessment Lower Extremity Assessment: Defer to PT evaluation;Overall [facility] for  tasks assessed   Cervical / Trunk Assessment Cervical / Trunk Assessment: Normal   Communication Communication Communication: No difficulties   Cognition Arousal/Alertness: Awake/alert Behavior During Therapy: WFL for tasks assessed/performed Overall Cognitive Status: Within Functional Limits for tasks assessed                                 General Comments: Patient AOx4   General Comments       Exercises Other Exercises Other Exercises: pt educated in energy conservation strategies and falls prevention strategies to support safety and maximize functional independence    Shoulder Instructions      Home Living Family/patient expects to be discharged to:: Private residence Living Arrangements: Alone   Type of Home: Apartment (rental apt) Home Access: Stairs to enter;Ramped entrance (pt reports there is a ramp available but this adds additional distance to get to her apartment) Entrance Stairs-Number of Steps: 1 step to enter Entrance Stairs-Rails: None Home Layout: One level     Bathroom Shower/Tub: Occupational psychologist: Handicapped height     Home Equipment: Environmental consultant - 2 wheels;Cane - single point;Grab bars - toilet;Grab bars - tub/shower;Shower seat          Prior Functioning/Environment Level of Independence: Independent with assistive device(s)        Comments: Patient Independent for ADLs, IADL including driving, retired Therapist, sports and EMS, makes Teacher, adult education; uses SPC for ambulation        OT Problem List: Pain;Decreased activity tolerance;Decreased knowledge of use of DME or AE      OT Treatment/Interventions: Self-care/ADL training;Therapeutic exercise;Therapeutic activities;Energy conservation;DME and/or AE instruction;Patient/family education    OT Goals(Current goals can be found in the care plan section) Acute Rehab OT Goals Patient Stated Goal: go home OT Goal Formulation: With patient Time For Goal Achievement: 01/07/17 Potential  to Achieve Goals: Good  OT Frequency: Min 1X/week   Barriers to D/C:    lives alone but reports having family that can help her out, 24/7 if needed       Co-evaluation              AM-PAC PT "6 Clicks" Daily Activity     Outcome Measure Help from another person eating meals?: None Help from another person taking care of personal grooming?: A Little Help from another person toileting, which includes using toliet, bedpan, or urinal?: A Little Help from another person bathing (including washing, rinsing, drying)?: A Little Help from another person to put on and taking off regular upper body clothing?: None Help from another person to put on and taking off regular lower body clothing?: A Little 6 Click Score: 20   End of Session    Activity Tolerance: Other (comment);Patient limited by pain (pt limited by nausea, headache) Patient left: in bed;with call bell/phone within reach;with bed alarm set;with nursing/sitter in room  OT Visit Diagnosis: Other abnormalities of gait and mobility (R26.89);Repeated falls (R29.6);History of falling (Z91.81);Pain Pain - part of body:  (headache)                Time: 1040-1103 OT Time Calculation (min): 23 min Charges:  OT General Charges $OT Visit: 1 Procedure OT Evaluation $OT Eval Low  Complexity: 1 Procedure OT Treatments $Self Care/Home Management : 8-22 mins G-Codes:     Jeni Salles, MPH, MS, OTR/L ascom [phone] 12/24/16, 1:17 PM

## 2016-12-24 NOTE — Evaluation (Signed)
Physical Therapy Evaluation Patient Details Name: Kim Walton MRN: [id number] DOB: 1955/11/23 Today's Date: 12/24/2016   History of Present Illness  Kim Walton is a 61 y.o. right hand dominant female who arrived from home to ED with altered mental status. In the ER she was found to have a urinary tract infection and lactic acidosis. Patient reports history of migranes, and apahasia.   Clinical Impression  Patient is a 61 yo right hand dominant female demonstrating decreased balance and activity tolerance with activity. Patient demonstrates ability to ambulate 19ft before worsening of HA symptoms and patient requested to stop ambulation. Patient demonstrates ability to perform x10 sit to stands indicating increased function LE strength and patient will benefit from further skilled therapy to address balance difficulties. Recommend Home Health PT    Follow Up Recommendations Home health PT    Equipment Recommendations       Recommendations for Other Services       Precautions / Restrictions Precautions Precautions: Fall      Mobility  Bed Mobility Overal bed mobility: Independent             General bed mobility comments: Patient independent for supine to sitting  Transfers Overall transfer level: Needs assistance Equipment used: None Transfers: Sit to/from Stand Sit to Stand: Min guard         General transfer comment: Patient requires min guard for performance of sit to stand transfers. Patient performs motion slowly.   Ambulation/Gait Ambulation/Gait assistance: Min guard Ambulation Distance (Feet): 30 Feet Assistive device: None Gait Pattern/deviations: Decreased step length - right;Decreased step length - left;Decreased stride length   Gait velocity interpretation: Below normal speed for age/gender General Gait Details: Patient performs ambulation slowly with bilateral decrease in step length noted.   Stairs            Wheelchair  Mobility    Modified Rankin (Stroke Patients Only)       Balance Overall balance assessment: Needs assistance Sitting-balance support: Feet unsupported Sitting balance-Leahy Scale: Good Sitting balance - Comments: Patient able to balance without use of hands/feet for balance   Standing balance support: No upper extremity supported Standing balance-Leahy Scale: Good Standing balance comment: Patient able to perform standing independently but able to balance with narrowing BOS such as tandem or SLS for prolonged periods of time Single Leg Stance - Right Leg: 3 Single Leg Stance - Left Leg: 3 Tandem Stance - Right Leg: 5 Tandem Stance - Left Leg: 5 Rh[place]berg - Eyes Opened: 20                   Pertinent Vitals/Pain Pain Assessment: 0-10 Pain Score: 7  Pain Location: Head Pain Descriptors / Indicators: Aching;Headache Pain Intervention(s): Limited activity within patient's tolerance;Monitored during session    Home Living Family/patient expects to be discharged to:: Private residence Living Arrangements: Alone   Type of Home: House Home Access: Stairs to enter Entrance Stairs-Rails: None Entrance Stairs-Number of Steps: 1 step to enter Home Layout: One level Home Equipment: Environmental consultant - 2 wheels;Cane - single point      Prior Function Level of Independence: Independent         Comments: Patient Independent for ADLs      Hand Dominance   Dominant Hand: Right    Extremity/Trunk Assessment   Upper Extremity Assessment Upper Extremity Assessment: Overall WFL for tasks assessed    Lower Extremity Assessment Lower Extremity Assessment: Overall WFL for tasks assessed  Communication   Communication: No difficulties  Cognition Arousal/Alertness: Awake/alert Behavior During Therapy: WFL for tasks assessed/performed Overall Cognitive Status: Within Functional Limits for tasks assessed                                 General Comments:  Patient AOx4      General Comments      Exercises Other Exercises Other Exercises: Patient ambulates for 29ft with min guard (stopped secondary to increase in HA), bridges x10, sit to stand x 10, tandem stance 2 x 10sec, ankle pumps (reviewed)   Assessment/Plan    PT Assessment Patient needs continued PT services  PT Problem List Decreased strength;Decreased range of motion;Decreased activity tolerance;Decreased balance;Decreased mobility;Decreased coordination;Pain       PT Treatment Interventions Gait training;Stair training;Therapeutic activities;Therapeutic exercise;Balance training;Neuromuscular re-education;Patient/family education    PT Goals (Current goals can be found in the Care Plan section)  Acute Rehab PT Goals Patient Stated Goal: Patient reports she would like to go home PT Goal Formulation: With patient Time For Goal Achievement: 01/07/17 Potential to Achieve Goals: Good    Frequency Min 2X/week   Barriers to discharge Decreased caregiver support Patient lives alone    Co-evaluation               AM-PAC PT "6 Clicks" Daily Activity  Outcome Measure Difficulty turning over in bed (including adjusting bedclothes, sheets and blankets)?: None Difficulty moving from lying on back to sitting on the side of the bed? : None Difficulty sitting down on and standing up from a chair with arms (e.g., wheelchair, bedside commode, etc,.)?: A Little Help needed moving to and from a bed to chair (including a wheelchair)?: A Little Help needed walking in hospital room?: A Little Help needed climbing 3-5 steps with a railing? : A Little 6 Click Score: 20    End of Session Equipment Utilized During Treatment: Gait belt Activity Tolerance: Patient tolerated treatment well Patient left: in bed Nurse Communication: Mobility status PT Visit Diagnosis: Unsteadiness on feet (R26.81);Other abnormalities of gait and mobility (R26.89);Repeated falls (R29.6);Muscle weakness  (generalized) (M62.81);History of falling (Z91.81);Difficulty in walking, not elsewhere classified (R26.2)    Time: 5366-4403 PT Time Calculation (min) (ACUTE ONLY): 20 min   Charges:   PT Evaluation $PT Eval Low Complexity: 1 Procedure PT Treatments $Gait Training: 8-22 mins   PT G CodesBlythe Stanford, PT DPT 12/24/16, 11:58 AM (336) [place] 12/24/2016, 11:56 AM

## 2016-12-24 NOTE — Evaluation (Signed)
Clinical/Bedside Swallow Evaluation Patient Details  Name: Dean Wonder MRN: [id number] Date of Birth: 06/14/1956  Today's Date: 12/24/2016 Time: SLP Start Time (ACUTE ONLY): 0900 SLP Stop Time (ACUTE ONLY): 1000 SLP Time Calculation (min) (ACUTE ONLY): 60 min  Past Medical History:  Past Medical History:  Diagnosis Date  . Bipolar 2 disorder, major depressive episode ([place])   . C2 cervical fracture ([place])   . Chronic kidney disease   . Depression   . Femur fracture, left ([place])   . High cholesterol   . Hip fracture, left ([place])   . Hypertension   . PTSD (post-traumatic stress disorder)    Past Surgical History:  Past Surgical History:  Procedure Laterality Date  . GASTRIC BYPASS     HPI:  Pt is a 61 y.o. female w/ medical h/o Bipolar 2 disorder, major depressive episode, PTSD, hip/femur fractures, HTN, CKD who presents to the ED for altered mental status. Patient responds to questions with her name only and laughs inappropriately. She denies any pain, reportedly a friend checked on her and found her altered and referred her to the ER for evaluation. 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 Pt indicated her communication abilities are "sometimes" minimally impacted by the residual impact of the TBI but that she feels "at my baseline this morning". Currently, pt is verbally communicating w/ staff and friends on the phone re: personal and business issues; follows commands appropriately.    Assessment / Plan / Recommendation Clinical Impression  Pt appears to present w/ an adequate oropharyngeal phase swallow function w/ no overt s/s of aspiration noted w/ trials assessed during evaluation. Pt consumed trials of thin liquids and purees/softened solids w/ no coughing or throat clearing; no change in vocal quality or respirations during trials. Oral phase c/b timely bolus management and mastication w/ full oral  clearing post swallow. Pt fed self w/out difficulty noted. Pt stated she felt at her baseline w/ eating and drinking abilities; no deficits noted by pt. Pt also swallowed pills w/ water w/ NSG in room. Recommend continue a regular diet consistency w/ general aspiration precautions. Pt appears to be communicating at her baseline w/ no deficits reported by pt herself. ST services will be available to f/u w/ pt if indicated while admitted. NSG updated.  SLP Visit Diagnosis: Dysphagia, unspecified (R13.10)    Aspiration Risk   (reduced)    Diet Recommendation  Regular diet consistency; Thin liquids. General aspiration precautions especially in current setting.   Medication Administration: Whole meds with liquid    Other  Recommendations Recommended Consults:  (n/a) Oral Care Recommendations: Oral care BID;Patient independent with oral care Other Recommendations:  (n/a)   Follow up Recommendations None      Frequency and Duration  n/a         Prognosis Prognosis for Safe Diet Advancement: Good Barriers to Reach Goals:  (none)      Swallow Study   General Date of Onset: 12/23/16 HPI: Pt is a 61 y.o. female w/ medical h/o Bipolar 2 disorder, major depressive episode, PTSD, hip/femur fractures, HTN, CKD who presents to the ED for altered mental status. Patient responds to questions with her name only and laughs inappropriately. She denies any pain, reportedly a friend checked on her and found her altered and referred her to the ER for evaluation. 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 Pt indicated her communication abilities are "sometimes" minimally impacted by the residual impact of the TBI but that she feels "at my baseline this morning". Currently, pt is verbally communicating w/ staff and friends on the phone re: personal and business issues; follows commands appropriately.  Type of Study: Bedside Swallow  Evaluation Previous Swallow Assessment: none per pt Diet Prior to this Study: Regular;Thin liquids Temperature Spikes Noted: No (wbc 9.1) Respiratory Status: Room air History of Recent Intubation: No Behavior/Cognition: Alert;Cooperative;Pleasant mood Oral Cavity Assessment: Within Functional Limits Oral Care Completed by SLP: Recent completion by staff Oral Cavity - Dentition: Adequate natural dentition Vision: Functional for self-feeding Self-Feeding Abilities: Able to feed self Patient Positioning: Upright in bed Baseline Vocal Quality: Normal Volitional Cough: Strong Volitional Swallow: Able to elicit    Oral/Motor/Sensory Function Overall Oral Motor/Sensory Function: Within functional limits   Ice Chips Ice chips: Within functional limits Presentation: Spoon (fed; 2 trials)   Thin Liquid Thin Liquid: Within functional limits Presentation: Cup;Self Fed (~10+ ozs)    Nectar Thick Nectar Thick Liquid: Not tested   Honey Thick Honey Thick Liquid: Not tested   Puree Puree: Within functional limits Presentation: Self Fed;Spoon (3 trials)   Solid   GO   Solid: Within functional limits Presentation: Self Fed;Spoon (5 trials)    Functional Assessment Tool Used: clinical judgement Functional Limitations: Swallowing Swallow Current Status ([id number]): At least 1 percent but less than 20 percent impaired, limited or restricted Swallow Goal Status [phone]): At least 1 percent but less than 20 percent impaired, limited or restricted Swallow Discharge Status [phone]): At least 1 percent but less than 20 percent impaired, limited or restricted    Orinda Kenner, MS, CCC-SLP Watson,Katherine 12/24/2016,3:20 PM

## 2016-12-24 NOTE — Consult Note (Signed)
Referring Physician: Tressia Miners    Chief Complaint: AMS  HPI: Kim Walton is an 61 y.o. female who was seen in the ED on 5/22 with altered mental status and admitted with UTI.  Patient reports that she has been septic in the past from UTI's.  Feels better today.  Was noted to be inappropriate and unable to answer any questions yesterday.  Work up since admission included an MRI that shows an acute infarct.  Initial NIHSS of 4.  Date last known well: Unable to determine Time last known well: Unable to determine tPA Given: No: Unable to determine LKW  Past Medical History:  Diagnosis Date  . Bipolar 2 disorder, major depressive episode ([place])   . C2 cervical fracture ([place])   . Chronic kidney disease   . Depression   . Femur fracture, left ([place])   . High cholesterol   . Hip fracture, left ([place])   . Hypertension   . PTSD (post-traumatic stress disorder)     Past Surgical History:  Procedure Laterality Date  . GASTRIC BYPASS      Family History  Problem Relation Age of Onset  . Family history unknown: Yes   Social History:  reports that she has never smoked. She has never used smokeless tobacco. She reports that she drinks alcohol. Her drug history is not on file.  Allergies:  Allergies  Allergen Reactions  . Betadine [Povidone Iodine] Hives  . Flexeril [Cyclobenzaprine] Other (See Comments)    Reaction:  Drops blood pressure   . Imitrex [Sumatriptan] Other (See Comments)    Reaction:  Cold sweats   . Iodine Hives  . Lidocaine Hives  . Lotrimin [Clotrimazole] Nausea And Vomiting    Medications:  I have reviewed the patient's current medications. Prior to Admission:  Prescriptions Prior to Admission  Medication Sig Dispense Refill Last Dose  . atenolol (TENORMIN) 25 MG tablet Take 25 mg by mouth 2 (two) times daily.   12/10/2015 at 0900  . sertraline (ZOLOFT) 100 MG tablet Take 100 mg by mouth 2 (two) times daily.    12/10/2015 at Unknown time  . simvastatin (ZOCOR) 20  MG tablet Take 20 mg by mouth at bedtime.   12/09/2015 at Unknown time  . sodium bicarbonate 650 MG tablet Take 650 mg by mouth 3 (three) times daily.    12/10/2015 at Unknown time  . topiramate (TOPAMAX) 50 MG tablet Take 50 mg by mouth 2 (two) times daily.   12/10/2015 at Unknown time  . acetaminophen (TYLENOL) 500 MG tablet Take 1,000 mg by mouth every 6 (six) hours as needed for mild pain or headache.   12/10/2015 at 0900  . aspirin 81 MG chewable tablet Chew 81 mg by mouth at bedtime.    12/09/2015 at 2300  . B Complex-C (SUPER B COMPLEX PO) Take 1 capsule by mouth daily.   12/10/2015 at Unknown time  . Biotin 10 MG CAPS Take 10 mg by mouth daily.    12/10/2015 at Unknown time  . Calcium Carbonate-Vitamin D (CALCIUM 600+D) 600-400 MG-UNIT tablet Take 1 tablet by mouth 2 (two) times daily.   12/10/2015 at Unknown time  . chlorthalidone (HYGROTON) 25 MG tablet Take 12.5 mg by mouth daily.    Not Taking at Unknown time  . CRANBERRY EXTRACT PO Take 1 capsule by mouth daily.   12/09/2015 at Unknown time  . diphenhydrAMINE (BENADRYL) 25 MG tablet Take 50 mg by mouth 2 (two) times daily as needed for allergies or sleep.  Past Week at Unknown time  . Ferrous Sulfate 27 MG TABS Take 27 mg by mouth daily with breakfast.   12/10/2015 at Unknown time  . lamoTRIgine (LAMICTAL) 100 MG tablet Take 100 mg by mouth 2 (two) times daily.   Not Taking at Unknown time  . lisinopril (PRINIVIL,ZESTRIL) 2.5 MG tablet Take 2.5 mg by mouth at bedtime.   Not Taking at Unknown time  . magnesium chloride (SLOW-MAG) 64 MG TBEC SR tablet Take 1 tablet by mouth at bedtime.   12/09/2015 at Unknown time  . MEGARED OMEGA-3 KRILL OIL 500 MG CAPS Take 500 mg by mouth daily.   12/10/2015 at Unknown time  . Melatonin 10 MG TABS Take 10 mg by mouth at bedtime.   12/09/2015 at Unknown time  . Multiple Vitamin (MULTIVITAMIN WITH MINERALS) TABS tablet Take 1 tablet by mouth daily.   12/10/2015 at Unknown time  . traMADol (ULTRAM) 50 MG tablet Take 1 tablet (50  mg total) by mouth every 6 (six) hours as needed. 15 tablet 0    Scheduled: . atenolol  25 mg Oral BID  . atorvastatin  40 mg Oral q1800  . clopidogrel  75 mg Oral Daily  . enoxaparin (LOVENOX) injection  40 mg Subcutaneous Q24H  . lamoTRIgine  100 mg Oral BID  . lisinopril  2.5 mg Oral Daily  . Melatonin  10 mg Oral QHS  . multivitamin with minerals  1 tablet Oral Daily  . sertraline  100 mg Oral BID  . topiramate  50 mg Oral BID    ROS: History obtained from the patient  General ROS: negative for - chills, fatigue, fever, night sweats, weight gain or weight loss Psychological ROS: negative for - behavioral disorder, hallucinations, memory difficulties, mood swings or suicidal ideation Ophthalmic ROS: negative for - blurry vision, double vision, eye pain or loss of vision ENT ROS: negative for - epistaxis, nasal discharge, oral lesions, sore throat, tinnitus or vertigo Allergy and Immunology ROS: negative for - hives or itchy/watery eyes Hematological and Lymphatic ROS: negative for - bleeding problems, bruising or swollen lymph nodes Endocrine ROS: negative for - galactorrhea, hair pattern changes, polydipsia/polyuria or temperature intolerance Respiratory ROS: negative for - cough, hemoptysis, shortness of breath or wheezing Cardiovascular ROS: negative for - chest pain, dyspnea on exertion, edema or irregular heartbeat Gastrointestinal ROS: negative for - abdominal pain, diarrhea, hematemesis, nausea/vomiting or stool incontinence Genito-Urinary ROS: negative for - dysuria, hematuria, incontinence or urinary frequency/urgency Musculoskeletal ROS: negative for - joint swelling or muscular weakness Neurological ROS: as noted in HPI Dermatological ROS: negative for rash and skin lesion changes  Physical Examination: Blood pressure (!) 128/52, pulse (!) 109, temperature 98.1 F (36.7 C), temperature source Oral, resp. rate 18, height 5\' 4"  (1.626 m), weight 75.8 kg (167 lb), SpO2  97 %.  HEENT-  Normocephalic, no lesions, without obvious abnormality.  Normal external eye and conjunctiva.  Normal TM's bilaterally.  Normal auditory canals and external ears. Normal external nose, mucus membranes and septum.  Normal pharynx. Cardiovascular- S1, S2 normal, pulses palpable throughout   Lungs- chest clear, no wheezing, rales, normal symmetric air entry Abdomen- soft, non-tender; bowel sounds normal; no masses,  no organomegaly Extremities- no edema Lymph-no adenopathy palpable Musculoskeletal-no joint tenderness, deformity or swelling Skin-warm and dry, no hyperpigmentation, vitiligo, or suspicious lesions  Neurological Examination   Mental Status: Alert, oriented, thought content appropriate.  Able to name Presidents back to [place].  Speech fluent without evidence of aphasia.  Able  to follow 3 step commands without difficulty. Cranial Nerves: II: Discs flat bilaterally; Visual fields grossly normal, pupils equal, round, reactive to light and accommodation III,IV, VI: ptosis not present, extra-ocular motions intact bilaterally V,VII: smile symmetric, facial light touch sensation normal bilaterally VIII: hearing normal bilaterally IX,X: gag reflex present XI: bilateral shoulder shrug XII: midline tongue extension Motor: Right : Upper extremity   5/5    Left:     Upper extremity   5/5  Lower extremity   5/5     Lower extremity   5/5 Tone and bulk:normal tone throughout; no atrophy noted Sensory: Pinprick and light touch intact throughout, bilaterally Deep Tendon Reflexes: 2+ and symmetric with absent AJ's bilaterally Plantars: Right: mute   Left: mute Cerebellar: Normal finger-to-nose and normal heel-to-shin testing bilaterally Gait: not tested due to safety concerns   Laboratory Studies:  Basic Metabolic Panel:  Recent Labs Lab 12/23/16 1157 12/24/16 0428  NA 142 140  K 5.4* 4.2  CL 115* 113*  CO2 14* 19*  GLUCOSE 144* 140*  BUN 50* 29*  CREATININE  1.61* 1.31*  CALCIUM 8.9 8.1*    Liver Function Tests:  Recent Labs Lab 12/23/16 1157  AST 47*  ALT 22  ALKPHOS 134*  BILITOT 0.7  PROT 7.5  ALBUMIN 3.8   No results for input(s): LIPASE, AMYLASE in the last 168 hours. No results for input(s): AMMONIA in the last 168 hours.  CBC:  Recent Labs Lab 12/23/16 1157 12/24/16 0428  WBC 8.4 9.1  NEUTROABS 5.8  --   HGB 11.6* 9.7*  HCT 34.6* 28.7*  MCV 101.3* 101.1*  PLT 256 144*    Cardiac Enzymes:  Recent Labs Lab 12/23/16 1157  TROPONINI <0.03    BNP: Invalid input(s): POCBNP  CBG: No results for input(s): GLUCAP in the last 168 hours.  Microbiology: Results for orders placed or performed during the hospital encounter of 12/23/16  Urine culture     Status: Abnormal (Preliminary result)   Collection Time: 12/23/16 11:57 AM  Result Value Ref Range Status   Specimen Description URINE, CLEAN CATCH  Final   Special Requests NONE  Final   Culture >=100,000 COLONIES/mL GRAM NEGATIVE RODS (A)  Final   Report Status PENDING  Incomplete  Culture, blood (routine x 2)     Status: None (Preliminary result)   Collection Time: 12/23/16 12:00 PM  Result Value Ref Range Status   Specimen Description BLOOD R FOREARM  Final   Special Requests   Final    BOTTLES DRAWN AEROBIC AND ANAEROBIC Blood Culture adequate volume   Culture  Setup Time   Final    GRAM NEGATIVE RODS IN BOTH AEROBIC AND ANAEROBIC BOTTLES CRITICAL RESULT CALLED TO, READ BACK BY AND VERIFIED WITH: DAVID BESANTI @ 4782 ON 12/24/2016 BY CAF    Culture GRAM NEGATIVE RODS  Final   Report Status PENDING  Incomplete  Blood Culture ID Panel (Reflexed)     Status: Abnormal   Collection Time: 12/23/16 12:00 PM  Result Value Ref Range Status   Enterococcus species NOT DETECTED NOT DETECTED Final   Listeria monocytogenes NOT DETECTED NOT DETECTED Final   Staphylococcus species NOT DETECTED NOT DETECTED Final   Staphylococcus aureus NOT DETECTED NOT DETECTED  Final   Streptococcus species NOT DETECTED NOT DETECTED Final   Streptococcus agalactiae NOT DETECTED NOT DETECTED Final   Streptococcus pneumoniae NOT DETECTED NOT DETECTED Final   Streptococcus pyogenes NOT DETECTED NOT DETECTED Final   Acinetobacter baumannii NOT DETECTED  NOT DETECTED Final   Enterobacteriaceae species DETECTED (A) NOT DETECTED Final    Comment: Enterobacteriaceae represent a large family of gram-negative bacteria, not a single organism. CRITICAL RESULT CALLED TO, READ BACK BY AND VERIFIED WITH: DAVID BESANTI @ 0938 ON 12/24/2016 BY CAF    Enterobacter cloacae complex NOT DETECTED NOT DETECTED Final   Escherichia coli DETECTED (A) NOT DETECTED Final    Comment: CRITICAL RESULT CALLED TO, READ BACK BY AND VERIFIED WITH: DAVID BESANTI @ 0250 ON 12/24/2016 BY CAF    Klebsiella oxytoca NOT DETECTED NOT DETECTED Final   Klebsiella pneumoniae NOT DETECTED NOT DETECTED Final   Proteus species NOT DETECTED NOT DETECTED Final   Serratia marcescens NOT DETECTED NOT DETECTED Final   Haemophilus influenzae NOT DETECTED NOT DETECTED Final   Neisseria meningitidis NOT DETECTED NOT DETECTED Final   Pseudomonas aeruginosa NOT DETECTED NOT DETECTED Final   Candida albicans NOT DETECTED NOT DETECTED Final   Candida glabrata NOT DETECTED NOT DETECTED Final   Candida krusei NOT DETECTED NOT DETECTED Final   Candida parapsilosis NOT DETECTED NOT DETECTED Final   Candida tropicalis NOT DETECTED NOT DETECTED Final  Culture, blood (routine x 2)     Status: None (Preliminary result)   Collection Time: 12/23/16 12:10 PM  Result Value Ref Range Status   Specimen Description BLOOD L FOREARM  Final   Special Requests   Final    BOTTLES DRAWN AEROBIC AND ANAEROBIC Blood Culture adequate volume   Culture  Setup Time   Final    GRAM NEGATIVE RODS IN BOTH AEROBIC AND ANAEROBIC BOTTLES CRITICAL RESULT CALLED TO, READ BACK BY AND VERIFIED WITH: DAVID BESANTI @ 0230 ON 12/24/2016 BY CAF     Culture GRAM NEGATIVE RODS  Final   Report Status PENDING  Incomplete    Coagulation Studies: No results for input(s): LABPROT, INR in the last 72 hours.  Urinalysis:  Recent Labs Lab 12/23/16 1157  COLORURINE YELLOW*  LABSPEC 1.011  PHURINE 5.0  GLUCOSEU NEGATIVE  HGBUR SMALL*  BILIRUBINUR NEGATIVE  KETONESUR NEGATIVE  PROTEINUR 30*  NITRITE NEGATIVE  LEUKOCYTESUR MODERATE*    Lipid Panel:    Component Value Date/Time   CHOL 105 12/24/2016 0428   TRIG 188 (H) 12/24/2016 0428   HDL 49 12/24/2016 0428   CHOLHDL 2.1 12/24/2016 0428   VLDL 38 12/24/2016 0428   LDLCALC 18 12/24/2016 0428    HgbA1C: No results found for: HGBA1C  Urine Drug Screen:     Component Value Date/Time   LABOPIA NONE DETECTED 12/23/2016 1157   COCAINSCRNUR NONE DETECTED 12/23/2016 1157   LABBENZ NONE DETECTED 12/23/2016 1157   AMPHETMU NONE DETECTED 12/23/2016 1157   THCU NONE DETECTED 12/23/2016 1157   LABBARB NONE DETECTED 12/23/2016 1157    Alcohol Level: No results for input(s): ETH in the last 168 hours.   Imaging: Ct Head Wo Contrast  Result Date: 12/23/2016 CLINICAL DATA:  61 year old female with altered mental status. Initial encounter. EXAM: CT HEAD WITHOUT CONTRAST TECHNIQUE: Contiguous axial images were obtained from the base of the skull through the vertex without intravenous contrast. COMPARISON:  12/09/2009 CT. FINDINGS: Brain: Artifact from earrings. No intracranial hemorrhage or CT evidence of large acute infarct. No hydrocephalus or age advanced atrophy. No intracranial mass lesion noted on this unenhanced exam. Vascular: Vascular calcifications. Skull: Hyperostosis frontalis interna incidentally noted. Sinuses/Orbits: No acute orbital abnormality. Orbits not entirely imaged. Partial opacification mid left ethmoid sinus air cell. Other: Visualized mastoid air cells and middle ear  cavities are clear. IMPRESSION: Streak artifact from earrings. No intracranial hemorrhage or CT  evidence of large acute infarct. No intracranial mass lesion noted on this unenhanced exam. Opacification mid ethmoid sinus air cell. Electronically Signed   By: Genia Del M.D.   On: 12/23/2016 13:05   Mr Brain Wo Contrast  Result Date: 12/23/2016 CLINICAL DATA:  Altered mental status, acute encephalopathy. Hypertensive. Submitted for urinary tract infection. EXAM: MRI HEAD WITHOUT CONTRAST TECHNIQUE: Multiplanar, multiecho pulse sequences of the brain and surrounding structures were obtained without intravenous contrast. COMPARISON:  Set MRI of the brain January 11, 2015 and CT HEAD May 22nd 2016 at 1242 hours FINDINGS: Mild motion degraded examination. BRAIN: 3 mm focus reduced diffusion RIGHT parietal lobe, too small to characterize on ADC map. No susceptibility artifact to suggest hemorrhage. Linear motion artifact LEFT parietal lobe. Ventricles and sulci are normal for patient's age. Minimal supratentorial white matter FLAIR T2 hyperintensities compatible with chronic small vessel ischemic disease, less than expected for age. 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 VASCULAR: Normal major intracranial vascular flow voids present at skull base. SKULL AND UPPER CERVICAL SPINE: No abnormal sellar expansion. No suspicious calvarial bone marrow signal. Craniocervical junction maintained. SINUSES/ORBITS: Small LEFT maxillary mucosal retention cyst. Mild paranasal sinus mucosal thickening. Mastoid air cells are well aerated. The included ocular globes and orbital contents are non-suspicious. Status post bilateral ocular lens implants. OTHER: None. IMPRESSION: 3 mm parietal acute/ subacute infarct versus motion artifact on this mildly motion degraded examination. Old small LEFT cerebellar infarct and minimal chronic small vessel ischemic disease. Electronically Signed   By: Elon Alas M.D.   On: 12/23/2016 17:16   US Carotid Bilateral (at [facility] And [facility] Only)  Result Date: 12/23/2016 CLINICAL DATA:   Altered mental status.  CVA. EXAM: BILATERAL CAROTID DUPLEX ULTRASOUND TECHNIQUE: Pearline Cables scale imaging, color Doppler and duplex ultrasound were performed of bilateral carotid and vertebral arteries in the neck. COMPARISON:  None. FINDINGS: Criteria: Quantification of carotid stenosis is based on velocity parameters that correlate the residual internal carotid diameter with NASCET-based stenosis levels, using the diameter of the distal internal carotid lumen as the denominator for stenosis measurement. The following velocity measurements were obtained: RIGHT ICA:  89 cm/sec CCA:  89 cm/sec SYSTOLIC ICA/CCA RATIO:  1.9 DIASTOLIC ICA/CCA RATIO:  1.5 ECA:  151 cm/sec LEFT ICA:  101 cm/sec CCA:  951 cm/sec SYSTOLIC ICA/CCA RATIO:  0.8 DIASTOLIC ICA/CCA RATIO:  1.4 ECA:  129 cm/sec RIGHT CAROTID ARTERY: Mild calcified plaque in the bulb. Low resistance internal carotid Doppler pattern is preserved. RIGHT VERTEBRAL ARTERY:  Antegrade. LEFT CAROTID ARTERY: Mild calcified plaque in the bulb. Low resistance internal carotid Doppler pattern. LEFT VERTEBRAL ARTERY:  Antegrade. IMPRESSION: Less than 50% stenosis in the right and left internal carotid artery's. Electronically Signed   By: Marybelle Killings M.D.   On: 12/23/2016 14:37   Dg Chest Portable 1 View  Result Date: 12/23/2016 CLINICAL DATA:  Altered mental status EXAM: PORTABLE CHEST 1 VIEW COMPARISON:  01/10/2015 FINDINGS: Cardiac shadow is within normal limits. The lungs are well-aerated without focal infiltrate or sizable effusion. No bony abnormality is noted. IMPRESSION: No acute abnormality seen. Electronically Signed   By: Inez Catalina M.D.   On: 12/23/2016 12:15    Assessment: 61 y.o. female admitted with UTI and AMS.  Appears at baseline today.  MRI of the brain reviewed and shows a small right parietal acute/subacute infarct.  Etiology likely secondary to small vessel disease but can  not rule out embolic etiology.  Carotid dopplers show no evidence of  hemodynamically significant stenosis.  Echocardiogram shows no cardiac source of emboli with an EF of 60-65%.  A1c pending, LDL 18.  Patient on a statin and ASA.   Although infarct apparent on imaging mental status changes likely not secondary to infarct but to a metabolic encephalopathy.    Stroke Risk Factors - hyperlipidemia and hypertension  Plan: 1. Agree with continued treatment of infection 2. PT consult, OT consult, Speech consult 3. Prophylactic therapy-ASA 81mg , Plavix 75mg  daily.  Continue statin 4. Telemetry monitoring 5. Frequent neuro checks    Alexis Goodell, MD Neurology [phone] 12/24/2016, 2:57 PM

## 2016-12-24 NOTE — Progress Notes (Addendum)
Pharmacy Antibiotic Note  Kim Walton is a 61 y.o. female admitted on 12/23/2016 with UTI.  Pharmacy has been consulted for ceftriaxone dosing. Pt has AMS and elevated LA at 4.7. Patient received piperacillin/tazo and vancomycin x 1 in ED  Plan: Begin ceftriaxone 1 g IV q 24 hours at 1800  5/23 pt. Growing 4/4 (anaerobic and aerobic bottles) GNR on GS; BCID: Enterobacteracaea -- detected E. Coli. Will switch abx to Meropenem 1g IV q12h per CrCl 44.4 ml/min  Height: 5\' 4"  (162.6 cm) Weight: 167 lb (75.8 kg) IBW/kg (Calculated) : 54.7  Temp (24hrs), Avg:98 F (36.7 C), Min:97.5 F (36.4 C), Max:98.6 F (37 C)   Recent Labs Lab 12/23/16 1157 12/23/16 1523 12/24/16 0428  WBC 8.4  --  9.1  CREATININE 1.61*  --   --   LATICACIDVEN 4.7* 4.6*  --     Estimated Creatinine Clearance: 36.1 mL/min (A) (by C-G formula based on SCr of 1.61 mg/dL (H)).    Allergies  Allergen Reactions  . Betadine [Povidone Iodine] Hives  . Flexeril [Cyclobenzaprine] Other (See Comments)    Reaction:  Drops blood pressure   . Imitrex [Sumatriptan] Other (See Comments)    Reaction:  Cold sweats   . Iodine Hives  . Lidocaine Hives  . Lotrimin [Clotrimazole] Nausea And Vomiting    Antimicrobials this admission: Vanc/Zosyn 5/22 x 1 Ceftriaxone 5/22 >>  Dose adjustments this admission:  Microbiology results: 5/22 BCx: sent 5/22 UCx: sent   Sputum:    MRSA PCR:   Thank you for allowing pharmacy to be a part of this patient's care.  Tobie Lords, PharmD, BCPS Clinical Pharmacist 12/24/2016

## 2016-12-24 NOTE — Plan of Care (Signed)
Problem: Acute Rehab PT Goals(only PT should resolve) Goal: Pt Will Transfer Bed To Chair/Chair To Bed Outcome: Progressing Patient will be able to stand with modified independence with SPC to demonstrate safety with transferring from the toilet at home. Goal: Pt Will Ambulate Outcome: Progressing Patient will be able to amb >100 ft with use of a SPC modified independently to demonstrate significant improvement with functional gait and ability to amb from her car to her house at home Goal: Pt Will Go Up/Down Stairs Patient will be able to perform stairs with modified independence with SPC to demonstrate significant improvement with ascending/descending stairs and ability to enter house.

## 2016-12-24 NOTE — Progress Notes (Signed)
OT Cancellation Note  Patient Details Name: Britany Callicott MRN: [id number] DOB: 1956-04-25   Cancelled Treatment:    Reason Eval/Treat Not Completed: Other (comment). Pt with neurologist then chaplain in room. Will re-attempt later this morning for OT evaluation.   Jeni Salles, MPH, MS, OTR/L ascom [phone] 12/24/16, 10:35 AM

## 2016-12-24 NOTE — Progress Notes (Signed)
[facility] [place] NAME: Kim Walton    MR#:  [id number]  DATE OF BIRTH:  August 21, 1955  SUBJECTIVE:  CHIEF COMPLAINT:   Chief Complaint  Patient presents with  . Altered Mental Status   - admitted with confusion and weakness - urine and blood cultures positive - MRI with an acute infarct  REVIEW OF SYSTEMS:  Review of Systems  Constitutional: Positive for malaise/fatigue. Negative for chills and fever.  HENT: Negative for congestion, hearing loss and nosebleeds.   Eyes: Negative for blurred vision and double vision.  Respiratory: Negative for cough, shortness of breath and wheezing.   Cardiovascular: Negative for chest pain, palpitations and leg swelling.  Gastrointestinal: Negative for abdominal pain, constipation, diarrhea, nausea and vomiting.  Genitourinary: Negative for dysuria.  Musculoskeletal: Negative for myalgias.  Neurological: Negative for dizziness, speech change, focal weakness, seizures and headaches.  Psychiatric/Behavioral: Negative for depression.    DRUG ALLERGIES:   Allergies  Allergen Reactions  . Betadine [Povidone Iodine] Hives  . Flexeril [Cyclobenzaprine] Other (See Comments)    Reaction:  Drops blood pressure   . Imitrex [Sumatriptan] Other (See Comments)    Reaction:  Cold sweats   . Iodine Hives  . Lidocaine Hives  . Lotrimin [Clotrimazole] Nausea And Vomiting    VITALS:  Blood pressure (!) 159/71, pulse (!) 104, temperature 98.1 F (36.7 C), temperature source Oral, resp. rate 20, height 5\' 4"  (1.626 m), weight 75.8 kg (167 lb), SpO2 97 %.  PHYSICAL EXAMINATION:  Physical Exam  GENERAL:  61 y.o.-year-old patient lying in the bed with no acute distress.  EYES: Pupils equal, round, reactive to light and accommodation. No scleral icterus. Extraocular muscles intact.  HEENT: Head atraumatic, normocephalic. Oropharynx and nasopharynx clear.  NECK:  Supple, no jugular venous distention.  No thyroid enlargement, no tenderness.  LUNGS: Normal breath sounds bilaterally, no wheezing, rales,rhonchi or crepitation. No use of accessory muscles of respiration.  CARDIOVASCULAR: S1, S2 normal. No murmurs, rubs, or gallops.  ABDOMEN: Soft, nontender, nondistended. Bowel sounds present. No organomegaly or mass.  EXTREMITIES: No pedal edema, cyanosis, or clubbing.  NEUROLOGIC: Cranial nerves II through XII are intact. Muscle strength 5/5 in all extremities. Sensation intact. Gait not checked. Global weakness present PSYCHIATRIC: The patient is alert and oriented x 2-3.  SKIN: No obvious rash, lesion, or ulcer.    LABORATORY PANEL:   CBC  Recent Labs Lab 12/24/16 0428  WBC 9.1  HGB 9.7*  HCT 28.7*  PLT 144*   ------------------------------------------------------------------------------------------------------------------  Chemistries   Recent Labs Lab 12/23/16 1157 12/24/16 0428  NA 142 140  K 5.4* 4.2  CL 115* 113*  CO2 14* 19*  GLUCOSE 144* 140*  BUN 50* 29*  CREATININE 1.61* 1.31*  CALCIUM 8.9 8.1*  AST 47*  --   ALT 22  --   ALKPHOS 134*  --   BILITOT 0.7  --    ------------------------------------------------------------------------------------------------------------------  Cardiac Enzymes  Recent Labs Lab 12/23/16 1157  TROPONINI <0.03   ------------------------------------------------------------------------------------------------------------------  RADIOLOGY:  Ct Head Wo Contrast  Result Date: 12/23/2016 CLINICAL DATA:  61 year old female with altered mental status. Initial encounter. EXAM: CT HEAD WITHOUT CONTRAST TECHNIQUE: Contiguous axial images were obtained from the base of the skull through the vertex without intravenous contrast. COMPARISON:  12/09/2009 CT. FINDINGS: Brain: Artifact from earrings. No intracranial hemorrhage or CT evidence of large acute infarct. No hydrocephalus or age advanced atrophy. No intracranial mass lesion noted  on this  unenhanced exam. Vascular: Vascular calcifications. Skull: Hyperostosis frontalis interna incidentally noted. Sinuses/Orbits: No acute orbital abnormality. Orbits not entirely imaged. Partial opacification mid left ethmoid sinus air cell. Other: Visualized mastoid air cells and middle ear cavities are clear. IMPRESSION: Streak artifact from earrings. No intracranial hemorrhage or CT evidence of large acute infarct. No intracranial mass lesion noted on this unenhanced exam. Opacification mid ethmoid sinus air cell. Electronically Signed   By: Genia Del M.D.   On: 12/23/2016 13:05   Mr Brain Wo Contrast  Result Date: 12/23/2016 CLINICAL DATA:  Altered mental status, acute encephalopathy. Hypertensive. Submitted for urinary tract infection. EXAM: MRI HEAD WITHOUT CONTRAST TECHNIQUE: Multiplanar, multiecho pulse sequences of the brain and surrounding structures were obtained without intravenous contrast. COMPARISON:  Set MRI of the brain January 11, 2015 and CT HEAD May 22nd 2016 at 1242 hours FINDINGS: Mild motion degraded examination. BRAIN: 3 mm focus reduced diffusion RIGHT parietal lobe, too small to characterize on ADC map. No susceptibility artifact to suggest hemorrhage. Linear motion artifact LEFT parietal lobe. Ventricles and sulci are normal for patient's age. Minimal supratentorial white matter FLAIR T2 hyperintensities compatible with chronic small vessel ischemic disease, less than expected for age. 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 VASCULAR: Normal major intracranial vascular flow voids present at skull base. SKULL AND UPPER CERVICAL SPINE: No abnormal sellar expansion. No suspicious calvarial bone marrow signal. Craniocervical junction maintained. SINUSES/ORBITS: Small LEFT maxillary mucosal retention cyst. Mild paranasal sinus mucosal thickening. Mastoid air cells are well aerated. The included ocular globes and orbital contents are non-suspicious. Status post bilateral ocular lens  implants. OTHER: None. IMPRESSION: 3 mm parietal acute/ subacute infarct versus motion artifact on this mildly motion degraded examination. Old small LEFT cerebellar infarct and minimal chronic small vessel ischemic disease. Electronically Signed   By: Elon Alas M.D.   On: 12/23/2016 17:16   US Carotid Bilateral (at [facility] And [facility] Only)  Result Date: 12/23/2016 CLINICAL DATA:  Altered mental status.  CVA. EXAM: BILATERAL CAROTID DUPLEX ULTRASOUND TECHNIQUE: Pearline Cables scale imaging, color Doppler and duplex ultrasound were performed of bilateral carotid and vertebral arteries in the neck. COMPARISON:  None. FINDINGS: Criteria: Quantification of carotid stenosis is based on velocity parameters that correlate the residual internal carotid diameter with NASCET-based stenosis levels, using the diameter of the distal internal carotid lumen as the denominator for stenosis measurement. The following velocity measurements were obtained: RIGHT ICA:  89 cm/sec CCA:  89 cm/sec SYSTOLIC ICA/CCA RATIO:  1.9 DIASTOLIC ICA/CCA RATIO:  1.5 ECA:  151 cm/sec LEFT ICA:  101 cm/sec CCA:  237 cm/sec SYSTOLIC ICA/CCA RATIO:  0.8 DIASTOLIC ICA/CCA RATIO:  1.4 ECA:  129 cm/sec RIGHT CAROTID ARTERY: Mild calcified plaque in the bulb. Low resistance internal carotid Doppler pattern is preserved. RIGHT VERTEBRAL ARTERY:  Antegrade. LEFT CAROTID ARTERY: Mild calcified plaque in the bulb. Low resistance internal carotid Doppler pattern. LEFT VERTEBRAL ARTERY:  Antegrade. IMPRESSION: Less than 50% stenosis in the right and left internal carotid artery's. Electronically Signed   By: Marybelle Killings M.D.   On: 12/23/2016 14:37   Dg Chest Portable 1 View  Result Date: 12/23/2016 CLINICAL DATA:  Altered mental status EXAM: PORTABLE CHEST 1 VIEW COMPARISON:  01/10/2015 FINDINGS: Cardiac shadow is within normal limits. The lungs are well-aerated without focal infiltrate or sizable effusion. No bony abnormality is noted. IMPRESSION: No acute  abnormality seen. Electronically Signed   By: Inez Catalina M.D.   On: 12/23/2016 12:15    EKG:  Orders placed or performed during the hospital encounter of 12/23/16  . ED EKG  . ED EKG    ASSESSMENT AND PLAN:   61 year old female with past medical history significant for bipolar disorder, CK D, depression and anxiety, hypertension, PTSD presents to hospital secondary to altered mental status.  #1 sepsis-secondary to urinary tract infection. Blood cultures positive for Escherichia coli and also urine cultures pending. -Continue meropenem at this time until sensitivities are available.  #2 acute metabolic encephalopathy-a candidate to sepsis. -Currently on antibiotics. Mental status is much improving.  #3 acute infarct-MRI of the head done for altered mental status showing an acute/subacute parietal infarct versus motion artifact. -No focal neurological deficits at this time. Neurology consult is pending. -Physical therapy and occupational therapy consults requested. -Continue Plavix and statin. 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  #4 metabolic acidosis-secondary to underlying sepsis. Improving. 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 -Improving. Monitor  #6 bipolar disorder-continue Topamax and Lamictal  #7 DVT prophylaxis-we'll start Lovenox     All the records are reviewed and case discussed with Care Management/Social Workerr. Management plans discussed with the patient, family and they are in agreement.  CODE STATUS:  Full Code  TOTAL TIME TAKING CARE OF THIS PATIENT: 38 minutes.   POSSIBLE D/C IN 1-2 DAYS, DEPENDING ON CLINICAL CONDITION.   Gladstone Lighter M.D on 12/24/2016 at 1:07 PM  Between 7am to 6pm - Pager - [phone]  After 6pm go to [email or web address] - password [email or web address] [facility]  Office  [phone]  CC: Primary care physician; Tera Helper, MD

## 2016-12-24 NOTE — Progress Notes (Addendum)
PHARMACY - PHYSICIAN COMMUNICATION CRITICAL VALUE ALERT - BLOOD CULTURE IDENTIFICATION (BCID)  Results for orders placed or performed during the hospital encounter of 12/23/16  Blood Culture ID Panel (Reflexed) (Collected: 12/23/2016 12:00 PM)  Result Value Ref Range   Enterococcus species NOT DETECTED NOT DETECTED   Listeria monocytogenes NOT DETECTED NOT DETECTED   Staphylococcus species NOT DETECTED NOT DETECTED   Staphylococcus aureus NOT DETECTED NOT DETECTED   Streptococcus species NOT DETECTED NOT DETECTED   Streptococcus agalactiae NOT DETECTED NOT DETECTED   Streptococcus pneumoniae NOT DETECTED NOT DETECTED   Streptococcus pyogenes NOT DETECTED NOT DETECTED   Acinetobacter baumannii NOT DETECTED NOT DETECTED   Enterobacteriaceae species DETECTED (A) NOT DETECTED   Enterobacter cloacae complex NOT DETECTED NOT DETECTED   Escherichia coli DETECTED (A) NOT DETECTED   Klebsiella oxytoca NOT DETECTED NOT DETECTED   Klebsiella pneumoniae NOT DETECTED NOT DETECTED   Proteus species NOT DETECTED NOT DETECTED   Serratia marcescens NOT DETECTED NOT DETECTED   Carbapenem resistance NOT DETECTED NOT DETECTED   Haemophilus influenzae NOT DETECTED NOT DETECTED   Neisseria meningitidis NOT DETECTED NOT DETECTED   Pseudomonas aeruginosa NOT DETECTED NOT DETECTED   Candida albicans NOT DETECTED NOT DETECTED   Candida glabrata NOT DETECTED NOT DETECTED   Candida krusei NOT DETECTED NOT DETECTED   Candida parapsilosis NOT DETECTED NOT DETECTED   Candida tropicalis NOT DETECTED NOT DETECTED    Name of physician (or Provider) Contacted: Rosilyn Mings  Changes to prescribed antibiotics required: Switched to Meropenem 1g IV q12h per CrCl 44.1 ml/min  Tobie Lords 12/24/2016  5:09 AM

## 2016-12-24 NOTE — Progress Notes (Signed)
CH responded to a consult for AD. CH met with Pt, provided AD and education on how to complete an AD. Pt requested for more time to review the AD and will contact the [facility] when she ready to complete the AD.    12/24/16 1400  Clinical Encounter Type  Visited With Patient  Visit Type Initial;Other (Comment)  Referral From Nurse  Consult/Referral To Chaplain  Spiritual Encounters  Spiritual Needs Literature;Other (Comment)

## 2016-12-24 NOTE — Care Management (Addendum)
Admitted to this facility with the diagnosis of acute encephalopathy. Lives alone. Last seen Dr. Letta Pate  In [place] 4 months ago. Contact person is Voncille Lo [phone]). [facility] [employer] in [place] 2 years ago. Home Health per [place] 2 years ago. No home oxygen. Grab bars shower chair, and cane in the home. Takes care of all basic and instrumental activities of daily living herself, drives. Having problems with balance issues, fell yesterday. Appetite is better. Prescriptions are filled at [facility] in [place].  Friend will transport Physical therapy evaluation completed. Recommending Home Health and physical therapy in the home. Would like [facility] again. Shelbie Ammons RN MSN CCM Care Management [phone]

## 2016-12-24 NOTE — Plan of Care (Signed)
Problem: Education: Goal: Knowledge of [place] [facility] Education information/materials will improve Outcome: Progressing VSS, free of falls during shift.  NIH 3 for mild dysarthria, expressive aphasia, unable to state correct age.  Neuro checks stable.  Reports HA 6-8/10, received PRN PO Tylenol 1000mg  x1.  Reported nausea, received PRN IV Zofran 4mg  x1, pt reported continued nausea, Dr. Jannifer Franklin paged, received ordered PRN PO Compazine 5mg  x1.  No other needs overnight.  Bed in low position, BSC at bedside.  Call bell within reach, [place].

## 2016-12-25 LAB — BASIC METABOLIC PANEL
Anion gap: 5 (ref 5–15)
BUN: 19 mg/dL (ref 6–20)
CHLORIDE: 111 mmol/L (ref 101–111)
CO2: 22 mmol/L (ref 22–32)
CREATININE: 1.55 mg/dL — AB (ref 0.44–1.00)
Calcium: 7.9 mg/dL — ABNORMAL LOW (ref 8.9–10.3)
GFR calc Af Amer: 40 mL/min — ABNORMAL LOW (ref >60)
GFR calc non Af Amer: 35 mL/min — ABNORMAL LOW (ref >60)
Glucose, Bld: 132 mg/dL — ABNORMAL HIGH (ref 65–99)
Potassium: 3.9 mmol/L (ref 3.5–5.1)
SODIUM: 138 mmol/L (ref 135–145)

## 2016-12-25 LAB — URINE CULTURE

## 2016-12-25 LAB — LACTIC ACID, PLASMA: Lactic Acid, Venous: 1.8 mmol/L (ref 0.5–1.9)

## 2016-12-25 LAB — HEMOGLOBIN A1C
HEMOGLOBIN A1C: 5.5 % (ref 4.8–5.6)
Mean Plasma Glucose: 111 mg/dL

## 2016-12-25 MED ORDER — ASPIRIN 81 MG PO CHEW
81.0000 mg | CHEWABLE_TABLET | Freq: Every day | ORAL | Status: DC
  Administered 2016-12-25 – 2016-12-26 (×2): 81 mg via ORAL
  Filled 2016-12-25 (×2): qty 1

## 2016-12-25 NOTE — Progress Notes (Signed)
[place] [facility] [place] NAME: Kim Walton    MR#:  [id number]  DATE OF BIRTH:  1955/08/19  SUBJECTIVE:  CHIEF COMPLAINT:   Chief Complaint  Patient presents with  . Altered Mental Status   - feels better, urine and blood cultures with gram negative rods - has chronic headache and low back pain  REVIEW OF SYSTEMS:  Review of Systems  Constitutional: Positive for malaise/fatigue. Negative for chills and fever.  HENT: Negative for congestion, hearing loss and nosebleeds.   Eyes: Negative for blurred vision and double vision.  Respiratory: Negative for cough, shortness of breath and wheezing.   Cardiovascular: Negative for chest pain, palpitations and leg swelling.  Gastrointestinal: Negative for abdominal pain, constipation, diarrhea, nausea and vomiting.  Genitourinary: Negative for dysuria.  Musculoskeletal: Positive for back pain. Negative for myalgias.  Neurological: Positive for headaches. Negative for dizziness, speech change, focal weakness and seizures.  Psychiatric/Behavioral: Negative for depression.    DRUG ALLERGIES:   Allergies  Allergen Reactions  . Betadine [Povidone Iodine] Hives  . Flexeril [Cyclobenzaprine] Other (See Comments)    Reaction:  Drops blood pressure   . Imitrex [Sumatriptan] Other (See Comments)    Reaction:  Cold sweats   . Iodine Hives  . Lidocaine Hives  . Lotrimin [Clotrimazole] Nausea And Vomiting    VITALS:  Blood pressure (!) 148/54, pulse 74, temperature 98.3 F (36.8 C), resp. rate 20, height 5\' 4"  (1.626 m), weight 75.8 kg (167 lb), SpO2 98 %.  PHYSICAL EXAMINATION:  Physical Exam  GENERAL:  61 y.o.-year-old patient lying in the bed with no acute distress.  EYES: Pupils equal, round, reactive to light and accommodation. No scleral icterus. Extraocular muscles intact.  HEENT: Head atraumatic, normocephalic. Oropharynx and nasopharynx clear.  NECK:  Supple, no jugular venous  distention. No thyroid enlargement, no tenderness.  LUNGS: Normal breath sounds bilaterally, no wheezing, rales,rhonchi or crepitation. No use of accessory muscles of respiration.  CARDIOVASCULAR: S1, S2 normal. No murmurs, rubs, or gallops.  ABDOMEN: Soft, nontender, nondistended. Bowel sounds present. No organomegaly or mass.  EXTREMITIES: No pedal edema, cyanosis, or clubbing.  NEUROLOGIC: Cranial nerves II through XII are intact. Muscle strength 5/5 in all extremities. Sensation intact. Gait not checked. Global weakness present PSYCHIATRIC: The patient is alert and oriented x 3.  SKIN: No obvious rash, lesion, or ulcer.    LABORATORY PANEL:   CBC  Recent Labs Lab 12/24/16 0428  WBC 9.1  HGB 9.7*  HCT 28.7*  PLT 144*   ------------------------------------------------------------------------------------------------------------------  Chemistries   Recent Labs Lab 12/23/16 1157  12/25/16 0452  NA 142  < > 138  K 5.4*  < > 3.9  CL 115*  < > 111  CO2 14*  < > 22  GLUCOSE 144*  < > 132*  BUN 50*  < > 19  CREATININE 1.61*  < > 1.55*  CALCIUM 8.9  < > 7.9*  AST 47*  --   --   ALT 22  --   --   ALKPHOS 134*  --   --   BILITOT 0.7  --   --   < > = values in this interval not displayed. ------------------------------------------------------------------------------------------------------------------  Cardiac Enzymes  Recent Labs Lab 12/23/16 1157  TROPONINI <0.03   ------------------------------------------------------------------------------------------------------------------  RADIOLOGY:  Ct Head Wo Contrast  Result Date: 12/23/2016 CLINICAL DATA:  61 year old female with altered mental status. Initial encounter. EXAM: CT HEAD WITHOUT CONTRAST TECHNIQUE: Contiguous axial  images were obtained from the base of the skull through the vertex without intravenous contrast. COMPARISON:  12/09/2009 CT. FINDINGS: Brain: Artifact from earrings. No intracranial hemorrhage or  CT evidence of large acute infarct. No hydrocephalus or age advanced atrophy. No intracranial mass lesion noted on this unenhanced exam. Vascular: Vascular calcifications. Skull: Hyperostosis frontalis interna incidentally noted. Sinuses/Orbits: No acute orbital abnormality. Orbits not entirely imaged. Partial opacification mid left ethmoid sinus air cell. Other: Visualized mastoid air cells and middle ear cavities are clear. IMPRESSION: Streak artifact from earrings. No intracranial hemorrhage or CT evidence of large acute infarct. No intracranial mass lesion noted on this unenhanced exam. Opacification mid ethmoid sinus air cell. Electronically Signed   By: Genia Del M.D.   On: 12/23/2016 13:05   Mr Brain Wo Contrast  Result Date: 12/23/2016 CLINICAL DATA:  Altered mental status, acute encephalopathy. Hypertensive. Submitted for urinary tract infection. EXAM: MRI HEAD WITHOUT CONTRAST TECHNIQUE: Multiplanar, multiecho pulse sequences of the brain and surrounding structures were obtained without intravenous contrast. COMPARISON:  Set MRI of the brain January 11, 2015 and CT HEAD May 22nd 2016 at 1242 hours FINDINGS: Mild motion degraded examination. BRAIN: 3 mm focus reduced diffusion RIGHT parietal lobe, too small to characterize on ADC map. No susceptibility artifact to suggest hemorrhage. Linear motion artifact LEFT parietal lobe. Ventricles and sulci are normal for patient's age. Minimal supratentorial white matter FLAIR T2 hyperintensities compatible with chronic small vessel ischemic disease, less than expected for age. 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 VASCULAR: Normal major intracranial vascular flow voids present at skull base. SKULL AND UPPER CERVICAL SPINE: No abnormal sellar expansion. No suspicious calvarial bone marrow signal. Craniocervical junction maintained. SINUSES/ORBITS: Small LEFT maxillary mucosal retention cyst. Mild paranasal sinus mucosal thickening. Mastoid air cells are  well aerated. The included ocular globes and orbital contents are non-suspicious. Status post bilateral ocular lens implants. OTHER: None. IMPRESSION: 3 mm parietal acute/ subacute infarct versus motion artifact on this mildly motion degraded examination. Old small LEFT cerebellar infarct and minimal chronic small vessel ischemic disease. Electronically Signed   By: Elon Alas M.D.   On: 12/23/2016 17:16   US Carotid Bilateral (at [facility] And Ap Only)  Result Date: 12/23/2016 CLINICAL DATA:  Altered mental status.  CVA. EXAM: BILATERAL CAROTID DUPLEX ULTRASOUND TECHNIQUE: Pearline Cables scale imaging, color Doppler and duplex ultrasound were performed of bilateral carotid and vertebral arteries in the neck. COMPARISON:  None. FINDINGS: Criteria: Quantification of carotid stenosis is based on velocity parameters that correlate the residual internal carotid diameter with NASCET-based stenosis levels, using the diameter of the distal internal carotid lumen as the denominator for stenosis measurement. The following velocity measurements were obtained: RIGHT ICA:  89 cm/sec CCA:  89 cm/sec SYSTOLIC ICA/CCA RATIO:  1.9 DIASTOLIC ICA/CCA RATIO:  1.5 ECA:  151 cm/sec LEFT ICA:  101 cm/sec CCA:  782 cm/sec SYSTOLIC ICA/CCA RATIO:  0.8 DIASTOLIC ICA/CCA RATIO:  1.4 ECA:  129 cm/sec RIGHT CAROTID ARTERY: Mild calcified plaque in the bulb. Low resistance internal carotid Doppler pattern is preserved. RIGHT VERTEBRAL ARTERY:  Antegrade. LEFT CAROTID ARTERY: Mild calcified plaque in the bulb. Low resistance internal carotid Doppler pattern. LEFT VERTEBRAL ARTERY:  Antegrade. IMPRESSION: Less than 50% stenosis in the right and left internal carotid artery's. Electronically Signed   By: Marybelle Killings M.D.   On: 12/23/2016 14:37   Dg Chest Portable 1 View  Result Date: 12/23/2016 CLINICAL DATA:  Altered mental status EXAM: PORTABLE CHEST 1 VIEW COMPARISON:  01/10/2015 FINDINGS: Cardiac shadow is within normal limits. The lungs are  well-aerated without focal infiltrate or sizable effusion. No bony abnormality is noted. IMPRESSION: No acute abnormality seen. Electronically Signed   By: Inez Catalina M.D.   On: 12/23/2016 12:15    EKG:   Orders placed or performed during the hospital encounter of 12/23/16  . ED EKG  . ED EKG    ASSESSMENT AND PLAN:   61 year old female with past medical history significant for bipolar disorder, CK D, depression and anxiety, hypertension, PTSD presents to hospital secondary to altered mental status.  #1 sepsis-secondary to urinary tract infection. Blood cultures positive for Escherichia coli and also urine cultures with gram negative rods. -Continue meropenem at this time until sensitivities are available.  #2 acute metabolic encephalopathy- due to sepsis. -Currently on antibiotics. Mental status is much improved and at baseline now  #3 acute infarct-MRI of the head done for altered mental status showing an acute/subacute parietal infarct. -No focal neurological deficits at this time. Neurology consult is appreciated. -Physical therapy and occupational therapy consults requested. Recommended home health -Continue Plavix and statin. Add aspirin. 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  #4 metabolic acidosis-secondary to underlying sepsis. Improving.  -Continue IV fluids and antibiotics at this time  #5 acute renal failure on CK D stage III--ATN from sepsis. -Improving. Monitor  #6 bipolar disorder-continue Topamax and Lamictal  #7 DVT prophylaxis-  Lovenox     All the records are reviewed and case discussed with Care Management/Social Workerr. Management plans discussed with the patient, family and they are in agreement.  CODE STATUS:  Full Code  TOTAL TIME TAKING CARE OF THIS PATIENT: 38 minutes.   POSSIBLE D/C Tomorrow, DEPENDING ON CLINICAL CONDITION.   Gladstone Lighter M.D on 12/25/2016 at 7:56 AM  Between 7am to 6pm - Pager - [phone]  After 6pm go to  [email or web address] - password [email or web address] [facility]  Office  [phone]  CC: Primary care physician; Tera Helper, MD

## 2016-12-25 NOTE — Progress Notes (Signed)
Physical Therapy Treatment Patient Details Name: Kim Walton MRN: [id number] DOB: February 18, 1956 Today's Date: 12/25/2016    History of Present Illness Kim Walton is a 61 y.o. right hand dominant female who arrived from home to ED with altered mental status. In the ER she was found to have a urinary tract infection and lactic acidosis. Patient reports history of migranes, and apahasia.     PT Comments    Ready for session.  Ambulated to/from bathroom pushing iv pole with min guard.  She then continued to ambulate 140' with SPC in R hand.  Pt with cautious gait and reaching for IV for support.  General weakness noted.  Participated in exercises as described below.  Discussed gait with pt.  Initially stated she has 3 canes and no walker at home.  Declined trial of rolling walker.  After discussion, pt stated she did have a walker and she agreed it would be beneficial to use initially upon discharge until she regains her strength as she was much more comfortable with gait when she had BUE support.     Follow Up Recommendations  Home health PT     Equipment Recommendations    ? Rolling walker.  Pt waivers on if she does or does not have one at home.   Recommendations for Other Services       Precautions / Restrictions Precautions Precautions: Fall Restrictions Weight Bearing Restrictions: No    Mobility  Bed Mobility Overal bed mobility: Independent                Transfers Overall transfer level: Needs assistance Equipment used: None Transfers: Sit to/from Stand Sit to Stand: Min guard         General transfer comment: min guard for safety, no LOB  Ambulation/Gait Ambulation/Gait assistance: Min guard Ambulation Distance (Feet): 140 Feet Assistive device: Straight cane;1 person hand held assist Gait Pattern/deviations: Step-through pattern;Decreased step length - right;Decreased step length - left   Gait velocity interpretation: Below normal speed for  age/gender General Gait Details: slow, used SPC right and opted to push iv pole with left, uncomfortable without BUE support with gait but declined walker   Stairs            Wheelchair Mobility    Modified Rankin (Stroke Patients Only)       Balance Overall balance assessment: Needs assistance Sitting-balance support: Feet supported Sitting balance-Leahy Scale: Good     Standing balance support: No upper extremity supported Standing balance-Leahy Scale: Fair Standing balance comment: unable to complete standing exercises without BUE support                            Cognition Arousal/Alertness: Awake/alert Behavior During Therapy: WFL for tasks assessed/performed Overall Cognitive Status: Within Functional Limits for tasks assessed                                        Exercises Other Exercises Other Exercises: standing ex x 10 for heel raises, marches, SLR with SPC and HHA x 1 for balance Other Exercises: toliet with ind self care Other Exercises: stood at sink to brush teeth with min guard    General Comments        Pertinent Vitals/Pain Pain Assessment: No/denies pain    Home Living  Prior Function            PT Goals (current goals can now be found in the care plan section) Progress towards PT goals: Progressing toward goals    Frequency    Min 2X/week      PT Plan Current plan remains appropriate    Co-evaluation              AM-PAC PT "6 Clicks" Daily Activity  Outcome Measure  Difficulty turning over in bed (including adjusting bedclothes, sheets and blankets)?: None Difficulty moving from lying on back to sitting on the side of the bed? : None Difficulty sitting down on and standing up from a chair with arms (e.g., wheelchair, bedside commode, etc,.)?: A Little Help needed moving to and from a bed to chair (including a wheelchair)?: A Little Help needed walking in  hospital room?: A Little Help needed climbing 3-5 steps with a railing? : A Little 6 Click Score: 20    End of Session Equipment Utilized During Treatment: Gait belt Activity Tolerance: Patient tolerated treatment well Patient left: in bed;with bed alarm set;with call bell/phone within reach         Time: 0846-0902 PT Time Calculation (min) (ACUTE ONLY): 16 min  Charges:  $Gait Training: 8-22 mins                    G Codes:       Chesley Noon, PTA 12/25/16, 10:15 AM

## 2016-12-26 LAB — CULTURE, BLOOD (ROUTINE X 2)
SPECIAL REQUESTS: ADEQUATE
Special Requests: ADEQUATE

## 2016-12-26 MED ORDER — CLOPIDOGREL BISULFATE 75 MG PO TABS: 75.0000 mg | ORAL_TABLET | Freq: Every day | ORAL | 1 refills | Status: AC

## 2016-12-26 MED ORDER — ATORVASTATIN CALCIUM 40 MG PO TABS: 40.0000 mg | ORAL_TABLET | Freq: Every day | ORAL | 2 refills | Status: AC

## 2016-12-26 MED ORDER — TRAMADOL HCL 50 MG PO TABS: 50.0000 mg | ORAL_TABLET | Freq: Four times a day (QID) | ORAL | 0 refills | Status: AC | PRN

## 2016-12-26 MED ORDER — CEPHALEXIN 500 MG PO CAPS: 500.0000 mg | ORAL_CAPSULE | Freq: Three times a day (TID) | ORAL | 0 refills | Status: DC

## 2016-12-26 NOTE — Progress Notes (Signed)
Pt being discharged home, discharge instructions and prescriptions reviewed with pt, states understanding, pt with no complaints 

## 2016-12-26 NOTE — Care Management (Signed)
Discharge to home today per Dr. Tressia Miners. Declining home health at this time. Family will transport Shelbie Ammons RN MSN CCM Care Management [phone]

## 2016-12-26 NOTE — Care Management Important Message (Signed)
Important Message  Patient Details  Name: Kim Walton MRN: [id number] Date of Birth: 1956/06/19   Medicare Important Message Given:  Yes    Shelbie Ammons, RN 12/26/2016, 8:35 AM

## 2016-12-26 NOTE — Discharge Summary (Signed)
[facility] [place] NAME: Kim Walton    MR#:  [id number]  DATE OF BIRTH:  12-23-1955  DATE OF ADMISSION:  12/23/2016   ADMITTING PHYSICIAN: Loletha Grayer, MD  DATE OF DISCHARGE: 12/26/2016  PRIMARY CARE PHYSICIAN: Tera Helper, MD   ADMISSION DIAGNOSIS:   CVA (cerebral vascular accident) ([place]) [I63.9] Acute cystitis without hematuria [N30.00] Sepsis, due to unspecified organism ([place]) [A41.9] Altered mental status, unspecified altered mental status type [R41.82]  DISCHARGE DIAGNOSIS:   Active Problems:   Acute encephalopathy   CVA (cerebral vascular accident) ([place])   SECONDARY DIAGNOSIS:   Past Medical History:  Diagnosis Date  . Bipolar 2 disorder, major depressive episode ([place])   . C2 cervical fracture ([place])   . Chronic kidney disease   . Depression   . Femur fracture, left ([place])   . High cholesterol   . Hip fracture, left ([place])   . Hypertension   . PTSD (post-traumatic stress disorder)     HOSPITAL COURSE:   61 year old female with past medical history significant for bipolar disorder, CK D, depression and anxiety, hypertension, PTSD presents to hospital secondary to altered mental status.  #1 sepsis-secondary to urinary tract infection. Urine cultures and Blood cultures positive for Escherichia coli sensitive to Rocephin. -Received meropenem in the hospital, being changed to Keflex at discharge for a total of 14 days  #2 acute metabolic encephalopathy- due to sepsis. -Currently on antibiotics. Mental status is much improved and at baseline now  #3 acute infarct-MRI of the head done for altered mental status showing an acute/subacute right parietal infarct. -No focal neurological deficits at this time. Neurology consult is appreciated. -Physical therapy and occupational therapy consults requested. Recommended home health -Continue Plavix and statin. on low-dose aspirin.  MRI also showing old  infarcts.  #4 metabolic acidosis-secondary to underlying sepsis. Improving.   #5 acute renal failure on CK D stage III--ATN from sepsis. -Improving. Monitor  #6 bipolar disorder-continue Topamax and Lamictal  Physical therapy recommended home health services. Patient refused home health. Being discharged today   DISCHARGE CONDITIONS:   Guarded CONSULTS OBTAINED:   Treatment Team:  Alexis Goodell, MD  DRUG ALLERGIES:   Allergies  Allergen Reactions  . Betadine [Povidone Iodine] Hives  . Flexeril [Cyclobenzaprine] Other (See Comments)    Reaction:  Drops blood pressure   . Imitrex [Sumatriptan] Other (See Comments)    Reaction:  Cold sweats   . Iodine Hives  . Lidocaine Hives  . Lotrimin [Clotrimazole] Nausea And Vomiting   DISCHARGE MEDICATIONS:   Allergies as of 12/26/2016      Reactions   Betadine [povidone Iodine] Hives   Flexeril [cyclobenzaprine] Other (See Comments)   Reaction:  Drops blood pressure    Imitrex [sumatriptan] Other (See Comments)   Reaction:  Cold sweats    Iodine Hives   Lidocaine Hives   Lotrimin [clotrimazole] Nausea And Vomiting      Medication List    STOP taking these medications   Biotin 10 MG Caps   chlorthalidone 25 MG tablet Commonly known as:  HYGROTON   simvastatin 20 MG tablet Commonly known as:  ZOCOR   sodium bicarbonate 650 MG tablet     TAKE these medications   acetaminophen 500 MG tablet Commonly known as:  TYLENOL Take 1,000 mg by mouth every 6 (six) hours as needed for mild pain or headache.   aspirin 81 MG chewable tablet Chew 81 mg by mouth at bedtime.  atenolol 25 MG tablet Commonly known as:  TENORMIN Take 25 mg by mouth 2 (two) times daily.   atorvastatin 40 MG tablet Commonly known as:  LIPITOR Take 1 tablet (40 mg total) by mouth daily at 6 PM.   CALCIUM 600+D 600-400 MG-UNIT tablet Generic drug:  Calcium Carbonate-Vitamin D Take 1 tablet by mouth 2 (two) times daily.   cephALEXin 500  MG capsule Commonly known as:  KEFLEX Take 1 capsule (500 mg total) by mouth 3 (three) times daily. X 11 more days   clopidogrel 75 MG tablet Commonly known as:  PLAVIX Take 1 tablet (75 mg total) by mouth daily.   CRANBERRY EXTRACT PO Take 1 capsule by mouth daily.   diphenhydrAMINE 25 MG tablet Commonly known as:  BENADRYL Take 50 mg by mouth 2 (two) times daily as needed for allergies or sleep.   Ferrous Sulfate 27 MG Tabs Take 27 mg by mouth daily with breakfast.   lamoTRIgine 100 MG tablet Commonly known as:  LAMICTAL Take 100 mg by mouth 2 (two) times daily.   lisinopril 2.5 MG tablet Commonly known as:  PRINIVIL,ZESTRIL Take 2.5 mg by mouth at bedtime.   MEGARED OMEGA-3 KRILL OIL 500 MG Caps Take 500 mg by mouth daily.   Melatonin 10 MG Tabs Take 10 mg by mouth at bedtime.   multivitamin with minerals Tabs tablet Take 1 tablet by mouth daily.   sertraline 100 MG tablet Commonly known as:  ZOLOFT Take 100 mg by mouth 2 (two) times daily.   SLOW-MAG 64 MG Tbec SR tablet Generic drug:  magnesium chloride Take 1 tablet by mouth at bedtime.   SUPER B COMPLEX PO Take 1 capsule by mouth daily.   topiramate 50 MG tablet Commonly known as:  TOPAMAX Take 50 mg by mouth 2 (two) times daily.   traMADol 50 MG tablet Commonly known as:  ULTRAM Take 1 tablet (50 mg total) by mouth every 6 (six) hours as needed.        DISCHARGE INSTRUCTIONS:   1. PCP follow-up in 1-2 weeks  DIET:   Cardiac diet  ACTIVITY:   Activity as tolerated  OXYGEN:   Home Oxygen: No.  Oxygen Delivery: room air  DISCHARGE LOCATION:   home   If you experience worsening of your admission symptoms, develop shortness of breath, life threatening emergency, suicidal or homicidal thoughts you must seek medical attention immediately by calling 911 or calling your MD immediately  if symptoms less severe.  You Must read complete instructions/literature along with all the possible  adverse reactions/side effects for all the Medicines you take and that have been prescribed to you. Take any new Medicines after you have completely understood and accpet all the possible adverse reactions/side effects.   Please note  You were cared for by a hospitalist during your hospital stay. If you have any questions about your discharge medications or the care you received while you were in the hospital after you are discharged, you can call the unit and asked to speak with the hospitalist on call if the hospitalist that took care of you is not available. Once you are discharged, your primary care physician will handle any further medical issues. Please note that NO REFILLS for any discharge medications will be authorized once you are discharged, as it is imperative that you return to your primary care physician (or establish a relationship with a primary care physician if you do not have one) for your aftercare needs so that  they can reassess your need for medications and monitor your lab values.    On the day of Discharge:  VITAL SIGNS:   Blood pressure (!) 131/57, pulse 73, temperature 98.2 F (36.8 C), temperature source Oral, resp. rate 14, height 5\' 4"  (1.626 m), weight 75.8 kg (167 lb), SpO2 97 %.  PHYSICAL EXAMINATION:    GENERAL:  61 y.o.-year-old patient lying in the bed with no acute distress.  EYES: Pupils equal, round, reactive to light and accommodation. No scleral icterus. Extraocular muscles intact.  HEENT: Head atraumatic, normocephalic. Oropharynx and nasopharynx clear.  NECK:  Supple, no jugular venous distention. No thyroid enlargement, no tenderness.  LUNGS: Normal breath sounds bilaterally, no wheezing, rales,rhonchi or crepitation. No use of accessory muscles of respiration.  CARDIOVASCULAR: S1, S2 normal. No murmurs, rubs, or gallops.  ABDOMEN: Soft, nontender, nondistended. Bowel sounds present. No organomegaly or mass.  EXTREMITIES: No pedal edema, cyanosis, or  clubbing.  NEUROLOGIC: Cranial nerves II through XII are intact. Muscle strength 5/5 in all extremities. Sensation intact. Gait not checked. Global weakness present PSYCHIATRIC: The patient is alert and oriented x 3.  SKIN: No obvious rash, lesion, or ulcer.    DATA REVIEW:   CBC  Recent Labs Lab 12/24/16 0428  WBC 9.1  HGB 9.7*  HCT 28.7*  PLT 144*    Chemistries   Recent Labs Lab 12/23/16 1157  12/25/16 0452  NA 142  < > 138  K 5.4*  < > 3.9  CL 115*  < > 111  CO2 14*  < > 22  GLUCOSE 144*  < > 132*  BUN 50*  < > 19  CREATININE 1.61*  < > 1.55*  CALCIUM 8.9  < > 7.9*  AST 47*  --   --   ALT 22  --   --   ALKPHOS 134*  --   --   BILITOT 0.7  --   --   < > = values in this interval not displayed.   Microbiology Results  Results for orders placed or performed during the hospital encounter of 12/23/16  Urine culture     Status: Abnormal   Collection Time: 12/23/16 11:57 AM  Result Value Ref Range Status   Specimen Description URINE, CLEAN CATCH  Final   Special Requests NONE  Final   Culture >=100,000 COLONIES/mL ESCHERICHIA COLI (A)  Final   Report Status 12/25/2016 FINAL  Final   Organism ID, Bacteria ESCHERICHIA COLI (A)  Final      Susceptibility   Escherichia coli - MIC*    AMPICILLIN >=32 RESISTANT Resistant     CEFAZOLIN <=4 SENSITIVE Sensitive     CEFTRIAXONE <=1 SENSITIVE Sensitive     CIPROFLOXACIN <=0.25 SENSITIVE Sensitive     GENTAMICIN <=1 SENSITIVE Sensitive     IMIPENEM <=0.25 SENSITIVE Sensitive     NITROFURANTOIN <=16 SENSITIVE Sensitive     TRIMETH/SULFA <=20 SENSITIVE Sensitive     AMPICILLIN/SULBACTAM >=32 RESISTANT Resistant     PIP/TAZO <=4 SENSITIVE Sensitive     Extended ESBL NEGATIVE Sensitive     * >=100,000 COLONIES/mL ESCHERICHIA COLI  Culture, blood (routine x 2)     Status: Abnormal (Preliminary result)   Collection Time: 12/23/16 12:00 PM  Result Value Ref Range Status   Specimen Description BLOOD R FOREARM  Final    Special Requests   Final    BOTTLES DRAWN AEROBIC AND ANAEROBIC Blood Culture adequate volume   Culture  Setup Time   Final  GRAM NEGATIVE RODS IN BOTH AEROBIC AND ANAEROBIC BOTTLES CRITICAL RESULT CALLED TO, READ BACK BY AND VERIFIED WITH: DAVID BESANTI @ 1324 ON 12/24/2016 BY CAF    Culture (A)  Final    ESCHERICHIA COLI SUSCEPTIBILITIES TO FOLLOW Performed at [facility], [place] [address], [place], [place] [address]    Report Status PENDING  Incomplete  Blood Culture ID Panel (Reflexed)     Status: Abnormal   Collection Time: 12/23/16 12:00 PM  Result Value Ref Range Status   Enterococcus species NOT DETECTED NOT DETECTED Final   Listeria monocytogenes NOT DETECTED NOT DETECTED Final   Staphylococcus species NOT DETECTED NOT DETECTED Final   Staphylococcus aureus NOT DETECTED NOT DETECTED Final   Streptococcus species NOT DETECTED NOT DETECTED Final   Streptococcus agalactiae NOT DETECTED NOT DETECTED Final   Streptococcus pneumoniae NOT DETECTED NOT DETECTED Final   Streptococcus pyogenes NOT DETECTED NOT DETECTED Final   Acinetobacter baumannii NOT DETECTED NOT DETECTED Final   Enterobacteriaceae species DETECTED (A) NOT DETECTED Final    Comment: Enterobacteriaceae represent a large family of gram-negative bacteria, not a single organism. CRITICAL RESULT CALLED TO, READ BACK BY AND VERIFIED WITH: DAVID BESANTI @ 7253 ON 12/24/2016 BY CAF    Enterobacter cloacae complex NOT DETECTED NOT DETECTED Final   Escherichia coli DETECTED (A) NOT DETECTED Final    Comment: CRITICAL RESULT CALLED TO, READ BACK BY AND VERIFIED WITH: DAVID BESANTI @ 0250 ON 12/24/2016 BY CAF    Klebsiella oxytoca NOT DETECTED NOT DETECTED Final   Klebsiella pneumoniae NOT DETECTED NOT DETECTED Final   Proteus species NOT DETECTED NOT DETECTED Final   Serratia marcescens NOT DETECTED NOT DETECTED Final   Haemophilus influenzae NOT DETECTED NOT DETECTED Final   Neisseria meningitidis NOT DETECTED  NOT DETECTED Final   Pseudomonas aeruginosa NOT DETECTED NOT DETECTED Final   Candida albicans NOT DETECTED NOT DETECTED Final   Candida glabrata NOT DETECTED NOT DETECTED Final   Candida krusei NOT DETECTED NOT DETECTED Final   Candida parapsilosis NOT DETECTED NOT DETECTED Final   Candida tropicalis NOT DETECTED NOT DETECTED Final  Culture, blood (routine x 2)     Status: None (Preliminary result)   Collection Time: 12/23/16 12:10 PM  Result Value Ref Range Status   Specimen Description BLOOD L FOREARM  Final   Special Requests   Final    BOTTLES DRAWN AEROBIC AND ANAEROBIC Blood Culture adequate volume   Culture  Setup Time   Final    GRAM NEGATIVE RODS IN BOTH AEROBIC AND ANAEROBIC BOTTLES CRITICAL RESULT CALLED TO, READ BACK BY AND VERIFIED WITH: DAVID BESANTI @ 0230 ON 12/24/2016 BY CAF    Culture GRAM NEGATIVE RODS  Final   Report Status PENDING  Incomplete    RADIOLOGY:  No results found.   Management plans discussed with the patient, family and they are in agreement.  CODE STATUS:     Code Status Orders        Start     Ordered   12/23/16 1859  Full code  Continuous     12/23/16 1858    Code Status History    Date Active Date Inactive Code Status Order ID Comments User Context   01/11/2015  2:12 AM 01/15/2015  8:41 PM Full Code [id number]  Fritzi Mandes, MD ED      TOTAL TIME TAKING CARE OF THIS PATIENT: 38 minutes.    Gladstone Lighter M.D on 12/26/2016 at 8:12 AM  Between 7am to  6pm - Pager - [phone]  After 6pm go to [email or web address] - [employer] [facility]  Office  [phone]  CC: Primary care physician; Tera Helper, MD   Note: This dictation was prepared with Dragon dictation along with smaller phrase technology. Any transcriptional errors that result from this process are unintentional.

## 2017-12-31 IMAGING — DX DG CHEST 1V PORT
1 series · 1 of 1 positions shown · non-contrast
Comparison: 01/10/2015

CLINICAL DATA: Altered mental status

EXAM:
PORTABLE CHEST 1 VIEW

[chest ap]
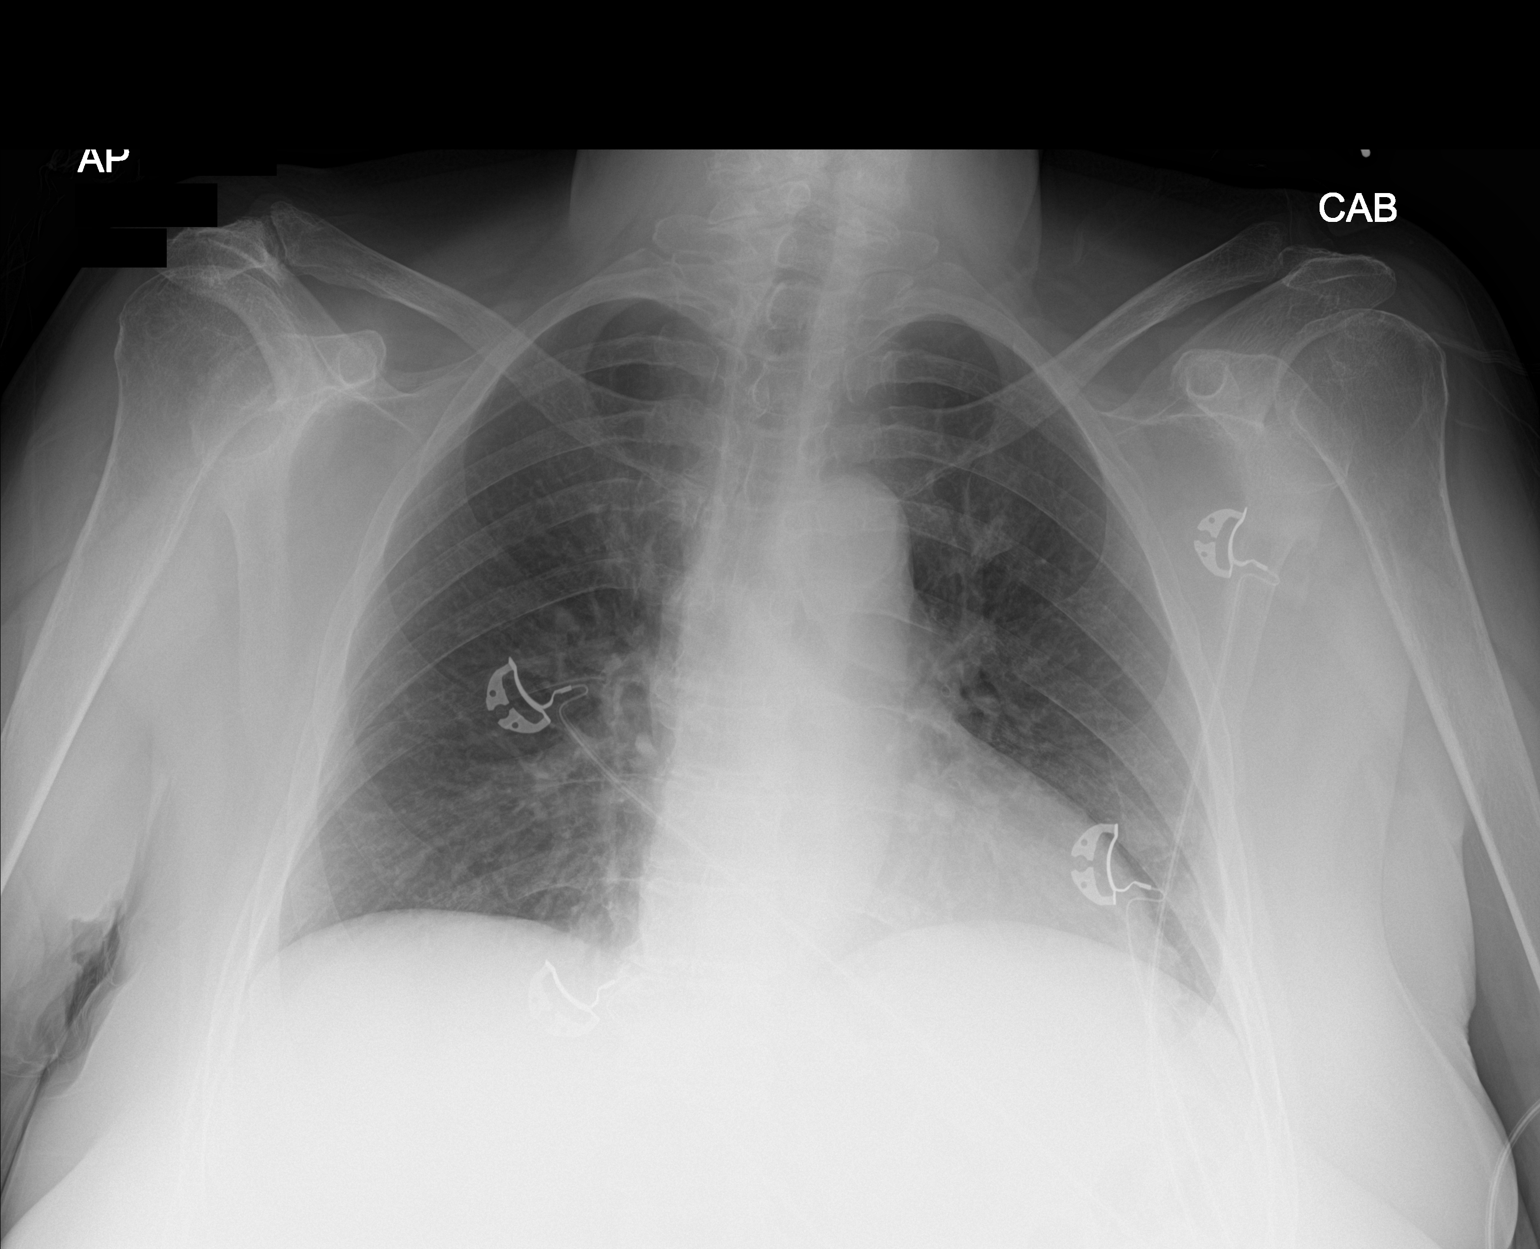

[1 of 1 positions shown; findings below may reference images not displayed]

FINDINGS: Cardiac shadow is within normal limits. The lungs are well-aerated
without focal infiltrate or sizable effusion. No bony abnormality is
noted.
IMPRESSION: No acute abnormality seen.

## 2017-12-31 IMAGING — MR MR HEAD W/O CM
10 series · 48 of 48 positions shown · non-contrast
Comparison: Set MRI of the brain January 11, 2015 and CT HEAD Wednesday December, 2014 at 8040 hours

CLINICAL DATA: Altered mental status, acute encephalopathy.
Hypertensive. Submitted for urinary tract infection.

EXAM:
MRI HEAD WITHOUT CONTRAST
TECHNIQUE: Multiplanar, multiecho pulse sequences of the brain and surrounding
structures were obtained without intravenous contrast.

[Series 2: T1 · sagittal · 5.0mm · 0.45mm/px · 4 of 29 slices shown (1 of 2)]
[im 1/29]
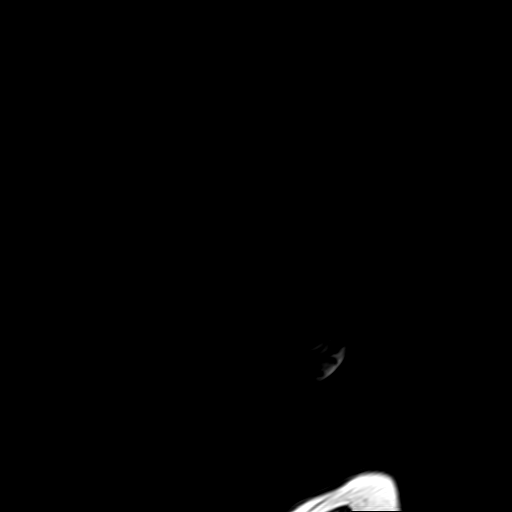
[im 10/29]
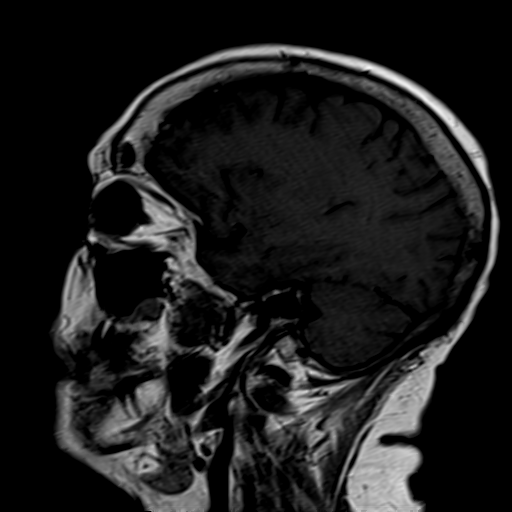
[im 19/29]
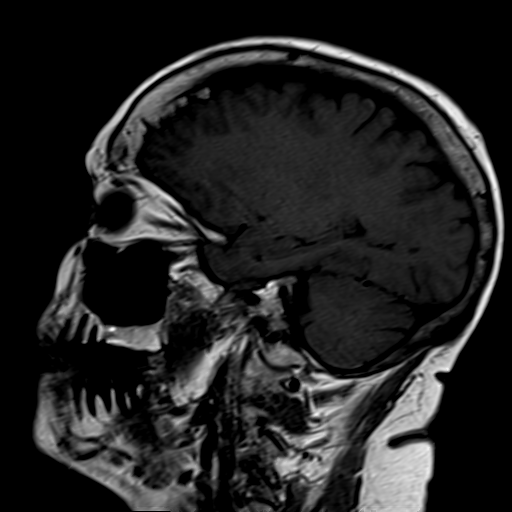
[im 29/29]
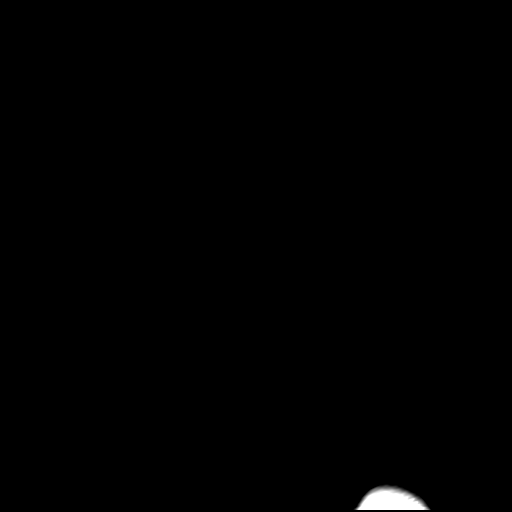

[Series 4: DWI · axial · 3.0mm · 1.80mm/px · z∈[-44,+109]mm · 6 of 55 slices shown (1 of 2)]
[im 1/55]
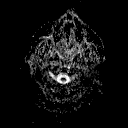
[im 11/55]
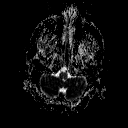
[im 22/55]
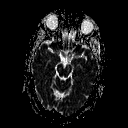
[im 33/55]
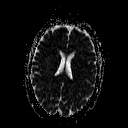
[im 44/55]
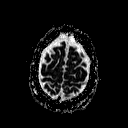
[im 55/55]
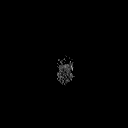

[Series 6: DWI · coronal · 3.0mm · 1.80mm/px · 6 of 49 slices shown (2 of 2)]
[im 1/49]
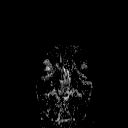
[im 10/49]
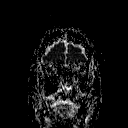
[im 20/49]
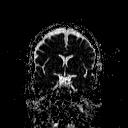
[im 29/49]
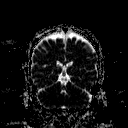
[im 39/49]
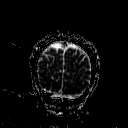
[im 49/49]
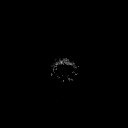

[Series 7: T2 · axial · 5.0mm · 0.90mm/px · z∈[-47,+112]mm · 3 of 27 slices shown (1 of 3)]
[im 1/27]
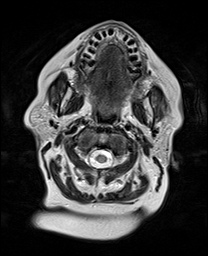
[im 14/27]
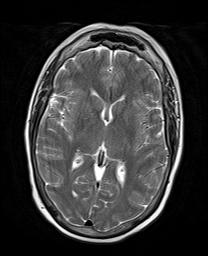
[im 27/27]
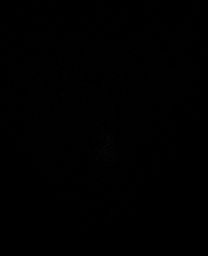

[Series 8: FLAIR · axial · 5.0mm · 0.45mm/px · z∈[-47,+112]mm · 3 of 27 slices shown]
[im 1/27]
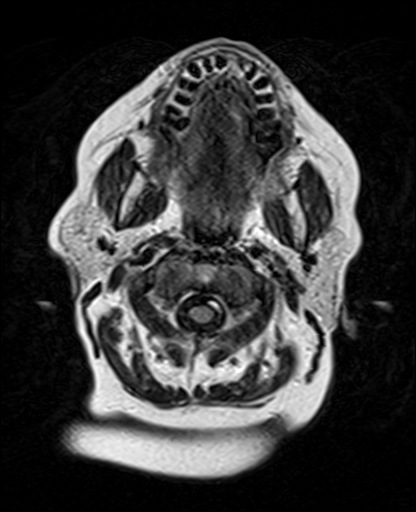
[im 14/27]
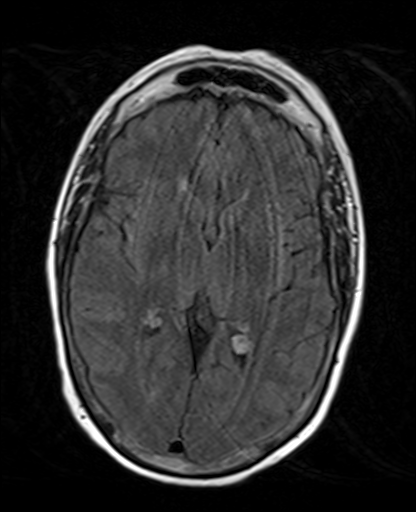
[im 27/27]
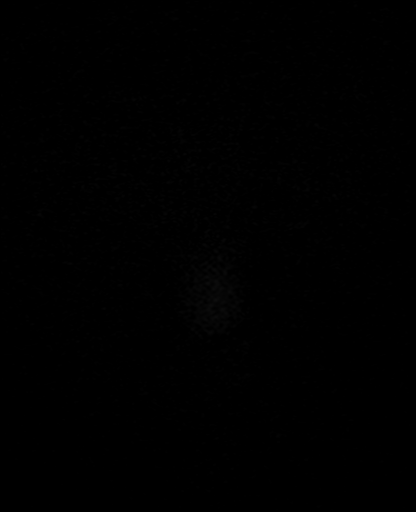

[Series 9: T2 · axial · 5.0mm · 0.45mm/px · z∈[-47,+112]mm · 3 of 27 slices shown (2 of 3)]
[im 1/27]
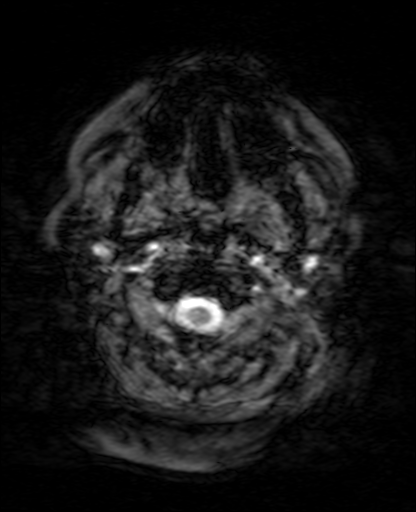
[im 14/27]
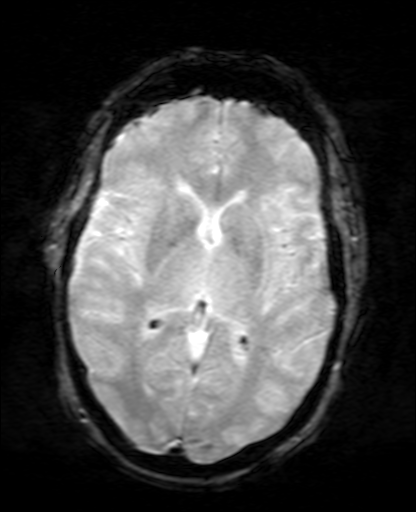
[im 27/27]
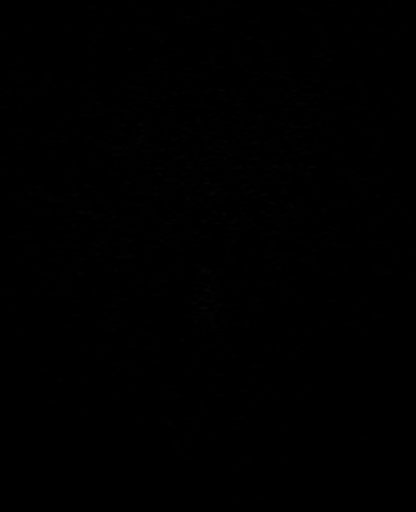

[Series 10: T1 · axial · 3.0mm · 1.00mm/px · z∈[-46,+121]mm · 7 of 60 slices shown (2 of 2)]
[im 1/60]
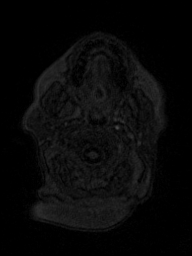
[im 10/60]
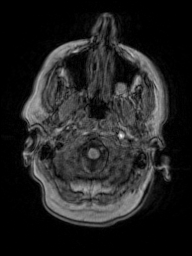
[im 20/60]
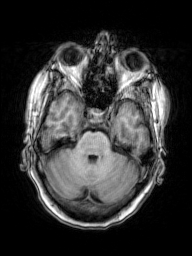
[im 30/60]
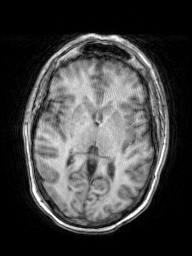
[im 40/60]
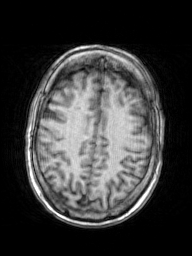
[im 50/60]
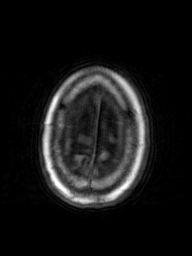
[im 60/60]
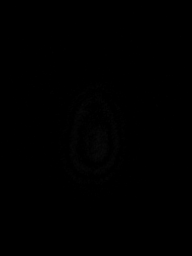

[Series 11: T2 · coronal · 5.0mm · 0.86mm/px · 4 of 31 slices shown (3 of 3)]
[im 1/31]
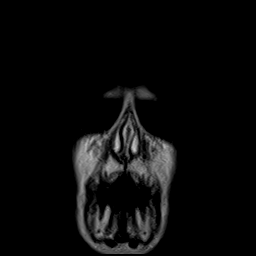
[im 11/31]
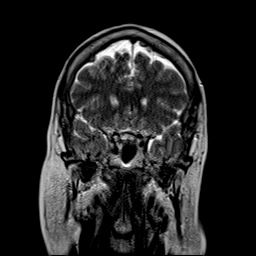
[im 21/31]
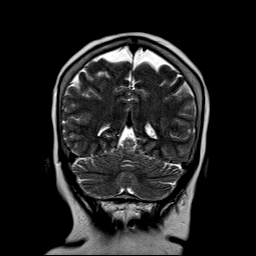
[im 31/31]
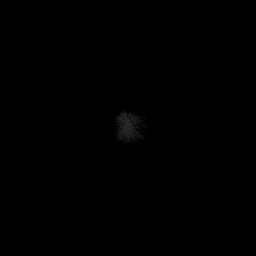

[Series 100: ax (id) · axial · 3.0mm · 1.80mm/px · z∈[-44,+109]mm · 6 of 55 slices shown]
[im 1/55]
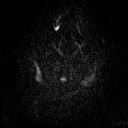
[im 11/55]
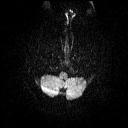
[im 22/55]
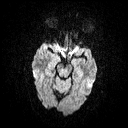
[im 33/55]
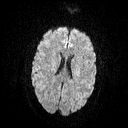
[im 44/55]
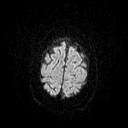
[im 55/55]
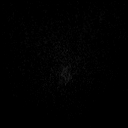

[Series 101: cor (id) · coronal · 3.0mm · 1.80mm/px · 6 of 48 slices shown]
[im 1/48]
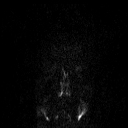
[im 10/48]
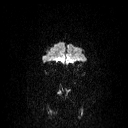
[im 19/48]
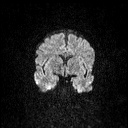
[im 29/48]
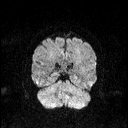
[im 38/48]
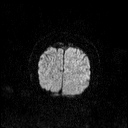
[im 48/48]
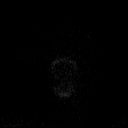

[48 of 48 positions shown; findings below may reference images not displayed]

FINDINGS: Mild motion degraded examination.

BRAIN: 3 mm focus reduced diffusion RIGHT parietal lobe, too small
to characterize on ADC map. No susceptibility artifact to suggest
hemorrhage. Linear motion artifact LEFT parietal lobe. Ventricles
and sulci are normal for patient's age. Minimal supratentorial white
matter FLAIR T2 hyperintensities compatible with chronic small
vessel ischemic disease, less than expected for age. Old small LEFT
inferior cerebellar infarct.

VASCULAR: Normal major intracranial vascular flow voids present at
skull base.

SKULL AND UPPER CERVICAL SPINE: No abnormal sellar expansion. No
suspicious calvarial bone marrow signal. Craniocervical junction
maintained.

SINUSES/ORBITS: Small LEFT maxillary mucosal retention cyst. Mild
paranasal sinus mucosal thickening. Mastoid air cells are well
aerated. The included ocular globes and orbital contents are
non-suspicious. Status post bilateral ocular lens implants.

OTHER: None.
IMPRESSION: 3 mm parietal acute/ subacute infarct versus motion artifact on this
mildly motion degraded examination.

Old small LEFT cerebellar infarct and minimal chronic small vessel
ischemic disease.

## 2021-01-28 ENCOUNTER — Ambulatory Visit (INDEPENDENT_AMBULATORY_CARE_PROVIDER_SITE_OTHER): Payer: Medicare HMO

## 2021-01-28 ENCOUNTER — Other Ambulatory Visit: Payer: Self-pay

## 2021-01-28 ENCOUNTER — Ambulatory Visit
Admission: EM | Admit: 2021-01-28 | Discharge: 2021-01-28 | Disposition: A | Discharge status: Home / Self Care | Payer: Medicare HMO

## 2021-01-28 DIAGNOSIS — M25532 Pain in left wrist: Secondary | ICD-10-CM

## 2021-01-28 DIAGNOSIS — W19XXXA Unspecified fall, initial encounter: Secondary | ICD-10-CM | POA: Diagnosis not present

## 2021-01-28 DIAGNOSIS — S63502A Unspecified sprain of left wrist, initial encounter: Secondary | ICD-10-CM | POA: Diagnosis not present

## 2021-01-28 NOTE — ED Triage Notes (Addendum)
Patient presents to Urgent Care with complaints of left wrist pain from a fall yesterday. She states she landed on her left wrist from trying to catch herself. Treating pain with ibuprofen and tylenol.

## 2021-01-28 NOTE — ED Provider Notes (Signed)
[facility]    CSN: [id number] Arrival date & time: 01/28/21  1109      History   Chief Complaint Chief Complaint  Patient presents with   Wrist Pain    Left    Fall    HPI Kim Walton is a 65 y.o. female.   HPI  Female here for evaluation of left wrist pain after a fall.  Patient reports that she was in her house and tripped over some laundry on the floor.  She reports that she tried to catch herself with her left wrist and wind up landing and putting all of her weight on the wrist.  She used RICE at home but that has not alleviated the pain.  Patient is complaining of point tender pain to the radial styloid of her left wrist.  She denies numbness, tingling, or weakness in her fingers.  She has full sensation and dexterity of her fingers and she also has full range of motion of her wrist despite the swelling.  Past Medical History:  Diagnosis Date   Bipolar 2 disorder, major depressive episode ([place])    C2 cervical fracture (HCC)    Chronic kidney disease    Depression    Femur fracture, left (HCC)    High cholesterol    Hip fracture, left (HCC)    Hypertension    PTSD (post-traumatic stress disorder)     Patient Active Problem List   Diagnosis Date Noted   Acute encephalopathy 12/23/2016   CVA (cerebral vascular accident) ([place]) 12/23/2016   Altered mental state 01/11/2015    Past Surgical History:  Procedure Laterality Date   GASTRIC BYPASS     HIP SURGERY      OB History   No obstetric history on file.      Home Medications    Prior to Admission medications   Medication Sig Start Date End Date Taking? Authorizing Provider  AMLODIPINE-ATORVASTATIN PO Take by mouth.   Yes [provider]  BIOTIN PO Take by mouth.   Yes [provider]  Cyanocobalamin (VITAMIN B 12 PO) Take by mouth.   Yes [provider]  VITAMIN D PO Take by mouth.   Yes [provider]  VITAMIN E PO Take by mouth.   Yes [provider]  acetaminophen (TYLENOL) 500 MG tablet Take 1,000 mg by mouth every 6 (six) hours as needed for mild pain or headache.    [provider]  aspirin 81 MG chewable tablet Chew 81 mg by mouth at bedtime.     [provider]  atenolol (TENORMIN) 25 MG tablet Take 25 mg by mouth 2 (two) times daily.    [provider]  atorvastatin (LIPITOR) 40 MG tablet Take 1 tablet (40 mg total) by mouth daily at 6 PM. 12/26/16   Gladstone Lighter, MD  B Complex-C (SUPER B COMPLEX PO) Take 1 capsule by mouth daily.    [provider]  Calcium Carbonate-Vitamin D (CALCIUM 600+D) 600-400 MG-UNIT tablet Take 1 tablet by mouth 2 (two) times daily.    [provider]  clopidogrel (PLAVIX) 75 MG tablet Take 1 tablet (75 mg total) by mouth daily. 12/26/16   Gladstone Lighter, MD  CRANBERRY EXTRACT PO Take 1 capsule by mouth daily.    [provider]  diphenhydrAMINE (BENADRYL) 25 MG tablet Take 50 mg by mouth 2 (two) times daily as needed for allergies or sleep.    [provider]  Ferrous Sulfate 27  MG TABS Take 27 mg by mouth daily with breakfast.    [provider]  lamoTRIgine (LAMICTAL) 100 MG tablet Take 100 mg by mouth 2 (two) times daily.    [provider]  lisinopril (PRINIVIL,ZESTRIL) 2.5 MG tablet Take 2.5 mg by mouth at bedtime.    [provider]  magnesium chloride (SLOW-MAG) 64 MG TBEC SR tablet Take 1 tablet by mouth at bedtime.    [provider]  Melatonin 10 MG TABS Take 10 mg by mouth at bedtime.    [provider]  Multiple Vitamin (MULTIVITAMIN WITH MINERALS) TABS tablet Take 1 tablet by mouth daily.    [provider]  sertraline (ZOLOFT) 100 MG tablet Take 100 mg by mouth 2 (two) times daily.     [provider]  topiramate (TOPAMAX) 50 MG tablet Take 50 mg by mouth 2 (two) times daily.    [provider]    Family History Family History  Family  history unknown: Yes    Social History Social History   Tobacco Use   Smoking status: Never   Smokeless tobacco: Never  Substance Use Topics   Alcohol use: Yes     Allergies   Betadine [povidone iodine], Flexeril [cyclobenzaprine], Imitrex [sumatriptan], Iodine, Lidocaine, and Lotrimin [clotrimazole]   Review of Systems Review of Systems  Constitutional:  Negative for activity change and appetite change.  Musculoskeletal:  Positive for arthralgias and joint swelling.  Skin:  Negative for color change.  Neurological:  Negative for weakness and numbness.  Hematological: Negative.   Psychiatric/Behavioral: Negative.      Physical Exam Triage Vital Signs ED Triage Vitals  Enc Vitals Group     BP 01/28/21 1209 (!) 154/70     Pulse Rate 01/28/21 1209 68     Resp 01/28/21 1209 16     Temp 01/28/21 1209 97.6 F (36.4 C)     Temp Source 01/28/21 1209 Oral     SpO2 01/28/21 1209 100 %     Weight --      Height --      Head Circumference --      Peak Flow --      Pain Score 01/28/21 1212 8     Pain Loc --      Pain Edu? --      Excl. in [place]? --    No data found.  Updated Vital Signs BP (!) 154/70 (BP Location: Right Arm)   Pulse 68   Temp 97.6 F (36.4 C) (Oral)   Resp 16   SpO2 100%   Visual Acuity Right Eye Distance:   Left Eye Distance:   Bilateral Distance:    Right Eye Near:   Left Eye Near:    Bilateral Near:     Physical Exam Vitals and nursing note reviewed.  Constitutional:      Appearance: Normal appearance.  HENT:     Head: Normocephalic and atraumatic.  Musculoskeletal:        General: Swelling and tenderness present. No deformity. Normal range of motion.  Skin:    General: Skin is warm and dry.     Capillary Refill: Capillary refill takes less than 2 seconds.     Findings: No bruising.  Neurological:     General: No focal deficit present.     Mental Status: She is alert and oriented to person, place, and time.     Sensory: No sensory  deficit.     Motor: No weakness.  Psychiatric:        Mood and Affect: Mood normal.        Behavior: Behavior normal.        Thought Content: Thought content normal.        Judgment: Judgment normal.     UC Treatments / Results  Labs (all labs ordered are listed, but only abnormal results are displayed) Labs Reviewed - No data to display  EKG   Radiology DG Wrist Complete Left  Result Date: 01/28/2021 CLINICAL DATA:  Tenderness over radial styloid after fall EXAM: LEFT WRIST - COMPLETE 3+ VIEW COMPARISON:  None. FINDINGS: Decreased osseous mineralization. Alignment is anatomic. No acute fracture. Joint spaces are preserved. IMPRESSION: No acute fracture. Electronically Signed   By: Macy Mis M.D.   On: 01/28/2021 13:24    Procedures Procedures (including critical care time)  Medications Ordered in UC Medications - No data to display  Initial Impression / Assessment and Plan / UC Course  I have reviewed the triage vital signs and the nursing notes.  Pertinent labs & imaging results that were available during my care of the patient were reviewed by me and considered in my medical decision making (see chart for details).  Patient is a very pleasant 20 old female here for evaluation of left wrist pain as outlined in HPI above.  Patient's physical exam reveals a mildly edematous left wrist at the carpal joint.  She has point tenderness over the radial styloid but no tenderness of the carpal bones, ulnar styloid, or hand.  She also has no tenderness with forced flexion or axial loading of the thumb.  Patient has full sensation and cap refills less than 2 seconds.  Will obtain radiograph of left wrist to rule out radial styloid fracture.  Radiology over read of left wrist films are negative for fracture.  Will discharge patient home with a response and diagnosis of a wrist sprain.  We will have patient continue Tylenol and ibuprofen as needed for pain as well as  elevation.   Final Clinical Impressions(s) / UC Diagnoses   Final diagnoses:  Sprain of left wrist, initial encounter     Discharge Instructions      Wear the wrist splint for the next week or so when you are up and moving around to give your wrist some support and keep it from having further injury.  Elevate your left wrist is much as possible to help decrease swelling and aid in pain relief.  Apply moist heat to the area for 20 minutes at a time 2-3 times a day.  This will increase blood flow to the area which will help with healing.  Continue using over-the-counter Tylenol as needed for pain.  Return for reevaluation, or see your primary care provider, for new or worsening symptoms.     ED Prescriptions   None    PDMP not reviewed this encounter.   Margarette Canada, NP 01/28/21 1334

## 2021-01-28 NOTE — Discharge Instructions (Addendum)
Wear the wrist splint for the next week or so when you are up and moving around to give your wrist some support and keep it from having further injury.  Elevate your left wrist is much as possible to help decrease swelling and aid in pain relief.  Apply moist heat to the area for 20 minutes at a time 2-3 times a day.  This will increase blood flow to the area which will help with healing.  Continue using over-the-counter Tylenol as needed for pain.  Return for reevaluation, or see your primary care provider, for new or worsening symptoms.

## 2021-10-18 ENCOUNTER — Inpatient Hospital Stay
Admission: EM | Admit: 2021-10-18 | Discharge: 2021-10-21 | DRG: 065 | Disposition: A | Discharge status: Home Health | Payer: Medicare HMO | Attending: Hospitalist | Admitting: Hospitalist

## 2021-10-18 ENCOUNTER — Emergency Department: Payer: Medicare HMO

## 2021-10-18 ENCOUNTER — Observation Stay: Payer: Medicare HMO

## 2021-10-18 ENCOUNTER — Other Ambulatory Visit: Payer: Self-pay

## 2021-10-18 DIAGNOSIS — R531 Weakness: Secondary | ICD-10-CM | POA: Diagnosis not present

## 2021-10-18 DIAGNOSIS — G8929 Other chronic pain: Secondary | ICD-10-CM | POA: Diagnosis present

## 2021-10-18 DIAGNOSIS — M542 Cervicalgia: Secondary | ICD-10-CM | POA: Diagnosis present

## 2021-10-18 DIAGNOSIS — F32A Depression, unspecified: Secondary | ICD-10-CM

## 2021-10-18 DIAGNOSIS — F4310 Post-traumatic stress disorder, unspecified: Secondary | ICD-10-CM | POA: Diagnosis present

## 2021-10-18 DIAGNOSIS — Z888 Allergy status to other drugs, medicaments and biological substances status: Secondary | ICD-10-CM

## 2021-10-18 DIAGNOSIS — G8194 Hemiplegia, unspecified affecting left nondominant side: Secondary | ICD-10-CM | POA: Diagnosis not present

## 2021-10-18 DIAGNOSIS — I10 Essential (primary) hypertension: Secondary | ICD-10-CM

## 2021-10-18 DIAGNOSIS — I639 Cerebral infarction, unspecified: Secondary | ICD-10-CM | POA: Diagnosis present

## 2021-10-18 DIAGNOSIS — Z79899 Other long term (current) drug therapy: Secondary | ICD-10-CM

## 2021-10-18 DIAGNOSIS — I129 Hypertensive chronic kidney disease with stage 1 through stage 4 chronic kidney disease, or unspecified chronic kidney disease: Secondary | ICD-10-CM | POA: Diagnosis present

## 2021-10-18 DIAGNOSIS — I6523 Occlusion and stenosis of bilateral carotid arteries: Secondary | ICD-10-CM | POA: Diagnosis present

## 2021-10-18 DIAGNOSIS — Z87820 Personal history of traumatic brain injury: Secondary | ICD-10-CM

## 2021-10-18 DIAGNOSIS — Z9884 Bariatric surgery status: Secondary | ICD-10-CM

## 2021-10-18 DIAGNOSIS — Z7902 Long term (current) use of antithrombotics/antiplatelets: Secondary | ICD-10-CM

## 2021-10-18 DIAGNOSIS — Z7982 Long term (current) use of aspirin: Secondary | ICD-10-CM

## 2021-10-18 DIAGNOSIS — R29810 Facial weakness: Secondary | ICD-10-CM | POA: Diagnosis present

## 2021-10-18 DIAGNOSIS — R29704 NIHSS score 4: Secondary | ICD-10-CM | POA: Diagnosis present

## 2021-10-18 DIAGNOSIS — F3181 Bipolar II disorder: Secondary | ICD-10-CM | POA: Diagnosis present

## 2021-10-18 DIAGNOSIS — I63511 Cerebral infarction due to unspecified occlusion or stenosis of right middle cerebral artery: Principal | ICD-10-CM | POA: Diagnosis present

## 2021-10-18 DIAGNOSIS — N184 Chronic kidney disease, stage 4 (severe): Secondary | ICD-10-CM

## 2021-10-18 DIAGNOSIS — Z23 Encounter for immunization: Secondary | ICD-10-CM

## 2021-10-18 DIAGNOSIS — Z8673 Personal history of transient ischemic attack (TIA), and cerebral infarction without residual deficits: Secondary | ICD-10-CM

## 2021-10-18 DIAGNOSIS — Z8659 Personal history of other mental and behavioral disorders: Secondary | ICD-10-CM

## 2021-10-18 DIAGNOSIS — E7800 Pure hypercholesterolemia, unspecified: Secondary | ICD-10-CM | POA: Diagnosis present

## 2021-10-18 LAB — COMPREHENSIVE METABOLIC PANEL
ALT: 12 U/L (ref 0–44)
AST: 19 U/L (ref 15–41)
Albumin: 3.9 g/dL (ref 3.5–5.0)
Alkaline Phosphatase: 91 U/L (ref 38–126)
Anion gap: 8 (ref 5–15)
BUN: 55 mg/dL — ABNORMAL HIGH (ref 8–23)
CO2: 21 mmol/L — ABNORMAL LOW (ref 22–32)
Calcium: 8.7 mg/dL — ABNORMAL LOW (ref 8.9–10.3)
Chloride: 110 mmol/L (ref 98–111)
Creatinine, Ser: 1.95 mg/dL — ABNORMAL HIGH (ref 0.44–1.00)
GFR, Estimated: 28 mL/min — ABNORMAL LOW (ref >60)
Glucose, Bld: 147 mg/dL — ABNORMAL HIGH (ref 70–99)
Potassium: 4.6 mmol/L (ref 3.5–5.1)
Sodium: 139 mmol/L (ref 135–145)
Total Bilirubin: 0.4 mg/dL (ref 0.3–1.2)
Total Protein: 7.5 g/dL (ref 6.5–8.1)

## 2021-10-18 LAB — DIFFERENTIAL
Abs Immature Granulocytes: 0.02 10*3/uL (ref 0.00–0.07)
Basophils Absolute: 0 10*3/uL (ref 0.0–0.1)
Basophils Relative: 0 %
Eosinophils Absolute: 0.2 10*3/uL (ref 0.0–0.5)
Eosinophils Relative: 3 %
Immature Granulocytes: 0 %
Lymphocytes Relative: 24 %
Lymphs Abs: 1.4 10*3/uL (ref 0.7–4.0)
Monocytes Absolute: 0.5 10*3/uL (ref 0.1–1.0)
Monocytes Relative: 8 %
Neutro Abs: 3.8 10*3/uL (ref 1.7–7.7)
Neutrophils Relative %: 65 %

## 2021-10-18 LAB — LIPID PANEL
Cholesterol: 199 mg/dL (ref 0–200)
HDL: 85 mg/dL (ref >40)
LDL Cholesterol: 81 mg/dL (ref 0–99)
Total CHOL/HDL Ratio: 2.3 RATIO
Triglycerides: 163 mg/dL — ABNORMAL HIGH (ref <150)
VLDL: 33 mg/dL (ref 0–40)

## 2021-10-18 LAB — CBC
HCT: 38.5 % (ref 36.0–46.0)
Hemoglobin: 11.9 g/dL — ABNORMAL LOW (ref 12.0–15.0)
MCH: 33 pg (ref 26.0–34.0)
MCHC: 30.9 g/dL (ref 30.0–36.0)
MCV: 106.6 fL — ABNORMAL HIGH (ref 80.0–100.0)
Platelets: 139 10*3/uL — ABNORMAL LOW (ref 150–400)
RBC: 3.61 MIL/uL — ABNORMAL LOW (ref 3.87–5.11)
RDW: 13.7 % (ref 11.5–15.5)
WBC: 5.8 10*3/uL (ref 4.0–10.5)
nRBC: 0 % (ref 0.0–0.2)

## 2021-10-18 LAB — APTT: aPTT: 26 seconds (ref 24–36)

## 2021-10-18 LAB — PROTIME-INR
INR: 1 (ref 0.8–1.2)
Prothrombin Time: 12.9 seconds (ref 11.4–15.2)

## 2021-10-18 LAB — CBG MONITORING, ED: Glucose-Capillary: 197 mg/dL — ABNORMAL HIGH (ref 70–99)

## 2021-10-18 MED ORDER — STROKE: EARLY STAGES OF RECOVERY BOOK: Freq: Once | Status: AC

## 2021-10-18 MED ORDER — ASPIRIN 81 MG PO CHEW: 81.0000 mg | CHEWABLE_TABLET | Freq: Every day | ORAL | Status: DC

## 2021-10-18 MED ORDER — ASPIRIN 325 MG PO TABS
325.0000 mg | ORAL_TABLET | Freq: Every day | ORAL | Status: DC
  Administered 2021-10-18 – 2021-10-19 (×2): 325 mg via ORAL
  Filled 2021-10-18 (×4): qty 1

## 2021-10-18 MED ORDER — ACETAMINOPHEN 500 MG PO TABS
1000.0000 mg | ORAL_TABLET | Freq: Three times a day (TID) | ORAL | Status: DC | PRN
  Administered 2021-10-18 – 2021-10-21 (×5): 1000 mg via ORAL
  Filled 2021-10-18 (×5): qty 2

## 2021-10-18 MED ORDER — ONDANSETRON 4 MG PO TBDP: 4.0000 mg | ORAL_TABLET | Freq: Three times a day (TID) | ORAL | Status: DC | PRN

## 2021-10-18 MED ORDER — PNEUMOCOCCAL 20-VAL CONJ VACC 0.5 ML IM SUSY
0.5000 mL | PREFILLED_SYRINGE | INTRAMUSCULAR | Status: AC
  Administered 2021-10-19: 0.5 mL via INTRAMUSCULAR
  Filled 2021-10-18: qty 0.5

## 2021-10-18 MED ORDER — TOPIRAMATE 25 MG PO TABS: 50.0000 mg | ORAL_TABLET | Freq: Two times a day (BID) | ORAL | Status: DC

## 2021-10-18 MED ORDER — MELATONIN 5 MG PO TABS
10.0000 mg | ORAL_TABLET | Freq: Every day | ORAL | Status: DC
  Administered 2021-10-18 – 2021-10-20 (×3): 10 mg via ORAL
  Filled 2021-10-18 (×3): qty 2

## 2021-10-18 MED ORDER — HEPARIN SODIUM (PORCINE) 5000 UNIT/ML IJ SOLN
5000.0000 [IU] | Freq: Three times a day (TID) | INTRAMUSCULAR | Status: DC
  Administered 2021-10-18 – 2021-10-21 (×8): 5000 [IU] via SUBCUTANEOUS
  Filled 2021-10-18 (×8): qty 1

## 2021-10-18 MED ORDER — SERTRALINE HCL 50 MG PO TABS: 100.0000 mg | ORAL_TABLET | Freq: Two times a day (BID) | ORAL | Status: DC

## 2021-10-18 MED ORDER — DIPHENHYDRAMINE HCL 25 MG PO TABS
50.0000 mg | ORAL_TABLET | Freq: Two times a day (BID) | ORAL | Status: DC | PRN
  Filled 2021-10-18: qty 2

## 2021-10-18 MED ORDER — SERTRALINE HCL 50 MG PO TABS
100.0000 mg | ORAL_TABLET | Freq: Every day | ORAL | Status: DC
  Administered 2021-10-19 – 2021-10-21 (×3): 100 mg via ORAL
  Filled 2021-10-18 (×3): qty 2

## 2021-10-18 MED ORDER — ONDANSETRON HCL 4 MG/2ML IJ SOLN
4.0000 mg | Freq: Four times a day (QID) | INTRAMUSCULAR | Status: DC | PRN
Start: 2021-10-18 — End: 2021-10-21

## 2021-10-18 MED ORDER — TOPIRAMATE 25 MG PO TABS
50.0000 mg | ORAL_TABLET | Freq: Two times a day (BID) | ORAL | Status: DC
Start: 2021-10-18 — End: 2021-10-21
  Administered 2021-10-18 – 2021-10-21 (×6): 50 mg via ORAL
  Filled 2021-10-18 (×6): qty 2

## 2021-10-18 MED ORDER — SODIUM CHLORIDE 0.9% FLUSH: 3.0000 mL | Freq: Once | INTRAVENOUS | Status: DC

## 2021-10-18 MED ORDER — CLOPIDOGREL BISULFATE 75 MG PO TABS
75.0000 mg | ORAL_TABLET | Freq: Every day | ORAL | Status: DC
  Administered 2021-10-19 – 2021-10-21 (×3): 75 mg via ORAL
  Filled 2021-10-18 (×3): qty 1

## 2021-10-18 MED ORDER — LAMOTRIGINE 100 MG PO TABS: 100.0000 mg | ORAL_TABLET | Freq: Two times a day (BID) | ORAL | Status: DC

## 2021-10-18 MED ORDER — LAMOTRIGINE 100 MG PO TABS
100.0000 mg | ORAL_TABLET | Freq: Two times a day (BID) | ORAL | Status: DC
  Administered 2021-10-18 – 2021-10-21 (×6): 100 mg via ORAL
  Filled 2021-10-18 (×6): qty 1

## 2021-10-18 MED ORDER — DOCUSATE SODIUM 100 MG PO CAPS: 100.0000 mg | ORAL_CAPSULE | Freq: Two times a day (BID) | ORAL | Status: DC | PRN

## 2021-10-18 MED ORDER — ATORVASTATIN CALCIUM 20 MG PO TABS
40.0000 mg | ORAL_TABLET | Freq: Every day | ORAL | Status: DC
  Administered 2021-10-19 – 2021-10-20 (×2): 40 mg via ORAL
  Filled 2021-10-18 (×2): qty 2

## 2021-10-18 MED ORDER — POLYETHYLENE GLYCOL 3350 17 G PO PACK: 17.0000 g | PACK | Freq: Two times a day (BID) | ORAL | Status: DC | PRN

## 2021-10-18 NOTE — ED Notes (Signed)
Informed RN bed assigned 

## 2021-10-18 NOTE — ED Provider Notes (Signed)
? ?[facility] ?Provider Note ? ? ? Event Date/Time  ? First MD Initiated Contact with Patient 10/18/21 1302   ?  (approximate) ? ? ?History  ? ?Weakness ? ? ?HPI ? ?Kim Walton is a 66 y.o. female with history of CVA, CKD, hypertension who presents with complaints of left-sided weakness.  Patient reports yesterday she developed relatively abrupt left leg weakness and balance issues, she thought things would get better overnight however when she woke up her left arm felt weak along with her left leg.  She is able to move them.  Denies difficulty speaking. ? ?  ? ? ?Physical Exam  ? ?Triage Vital Signs: ?ED Triage Vitals  ?Enc Vitals Group  ?   BP 10/18/21 1231 (!) 132/50  ?   Pulse Rate 10/18/21 1231 63  ?   Resp 10/18/21 1231 17  ?   Temp 10/18/21 1234 (!) 97.5 ?F (36.4 ?C)  ?   Temp src --   ?   SpO2 10/18/21 1231 97 %  ?   Weight 10/18/21 1231 63.5 kg (140 lb)  ?   Height 10/18/21 1231 1.676 m (5\' 6" )  ?   Head Circumference --   ?   Peak Flow --   ?   Pain Score 10/18/21 1231 0  ?   Pain Loc --   ?   Pain Edu? --   ?   Excl. in [place]? --   ? ? ?Most recent vital signs: ?Vitals:  ? 10/18/21 1330 10/18/21 1400  ?BP: 113/62 (!) 91/42  ?Pulse: 61 (!) 57  ?Resp: 16 16  ?Temp:  98 ?F (36.7 ?C)  ?SpO2: 100% 98%  ? ? ? ?General: Awake, no distress.  ?CV:  Good peripheral perfusion.  ?Resp:  Normal effort.  ?Abd:  No distention.  ?Other:  Cranial nerves II through XII are normal.  Mildly decreased strength in the left upper extremity and left lower extremity, sensation appears intact, PERRLA ? ? ?ED Results / Procedures / Treatments  ? ?Labs ?(all labs ordered are listed, but only abnormal results are displayed) ?Labs Reviewed  ?CBC - Abnormal; Notable for the following components:  ?    Result Value  ? RBC 3.61 (*)   ? Hemoglobin 11.9 (*)   ? MCV 106.6 (*)   ? Platelets 139 (*)   ? All other components within normal limits  ?COMPREHENSIVE METABOLIC PANEL - Abnormal; Notable for the following  components:  ? CO2 21 (*)   ? Glucose, Bld 147 (*)   ? BUN 55 (*)   ? Creatinine, Ser 1.95 (*)   ? Calcium 8.7 (*)   ? GFR, Estimated 28 (*)   ? All other components within normal limits  ?CBG MONITORING, ED - Abnormal; Notable for the following components:  ? Glucose-Capillary 197 (*)   ? All other components within normal limits  ?PROTIME-INR  ?APTT  ?DIFFERENTIAL  ?LIPID PANEL  ?HIV ANTIBODY (ROUTINE TESTING W REFLEX)  ? ? ? ?EKG ? ?ED ECG REPORT ?I, Lavonia Drafts, the attending physician, personally viewed and interpreted this ECG. ? ?Date: 10/18/2021 ? ?Rhythm: normal sinus rhythm ?QRS Axis: normal ?Intervals: normal ?ST/T Wave abnormalities: Nonspecific changes ?Narrative Interpretation: no evidence of acute ischemia ? ? ? ?RADIOLOGY ?CT head viewed by me, no acute abnormality ? ? ? ?PROCEDURES: ? ?Critical Care performed:  ? ?Procedures ? ? ?MEDICATIONS ORDERED IN ED: ?Medications  ?sodium chloride flush (NS) 0.9 % injection 3 mL (3  mLs Intravenous Not Given 10/18/21 1302)  ?aspirin tablet 325 mg (has no administration in time range)  ?aspirin chewable tablet 81 mg (has no administration in time range)  ?atorvastatin (LIPITOR) tablet 40 mg (has no administration in time range)  ?clopidogrel (PLAVIX) tablet 75 mg (has no administration in time range)  ?diphenhydrAMINE (BENADRYL) tablet 50 mg (has no administration in time range)  ? stroke: mapping our early stages of recovery book (has no administration in time range)  ?heparin injection 5,000 Units (has no administration in time range)  ?acetaminophen (TYLENOL) tablet 1,000 mg (has no administration in time range)  ?docusate sodium (COLACE) capsule 100 mg (has no administration in time range)  ?polyethylene glycol (MIRALAX / GLYCOLAX) packet 17 g (has no administration in time range)  ?ondansetron (ZOFRAN-ODT) disintegrating tablet 4 mg (has no administration in time range)  ?ondansetron (ZOFRAN) injection 4 mg (has no administration in time range)   ?sertraline (ZOLOFT) tablet 100 mg (has no administration in time range)  ?lamoTRIgine (LAMICTAL) tablet 100 mg (has no administration in time range)  ?topiramate (TOPAMAX) tablet 50 mg (has no administration in time range)  ? ? ? ?IMPRESSION / MDM / ASSESSMENT AND PLAN / ED COURSE  ?I reviewed the triage vital signs and the nursing notes. ? ?Patient presents with left upper extremity and left lower extremity weakness as detailed above, she describes her arm and leg feeling heavy.  She does have a history of a CVA in the past.  Blood work today is overall reassuring, normal CBC, normal CMP.  CT without evidence of acute infarct. ? ?However her presentation is suspicious for CVA, will discuss with the hospitalist for admission and further work-up ? ? ? ? ? ? ? ?  ? ? ?FINAL CLINICAL IMPRESSION(S) / ED DIAGNOSES  ? ?Final diagnoses:  ?Cerebrovascular accident (CVA), unspecified mechanism ([place])  ? ? ? ?Rx / DC Orders  ? ?ED Discharge Orders   ? ? None  ? ?  ? ? ? ?Note:  This document was prepared using Dragon voice recognition software and may include unintentional dictation errors. ?  ?Lavonia Drafts, MD ?10/18/21 1451 ? ?
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
## 2021-10-18 NOTE — ED Triage Notes (Signed)
Patient to ER via POV. Was advised to be seen in the emergency department by her pcp. Patient reports onset of left sided leg weakness that started at 2pm yesterday. Patient reports now being extremely unsteady on her feet and a lack of coordination. Today reports waking up with weakness in her right arm also. ? ?No speech deficits. No facial droop noted. No blurred vision or double vision. ?

## 2021-10-19 ENCOUNTER — Observation Stay: Payer: Medicare HMO

## 2021-10-19 ENCOUNTER — Observation Stay
Admit: 2021-10-19 | Discharge: 2021-10-19 | Disposition: A | Discharge status: Home / Self Care | Payer: Medicare HMO | Attending: Hospitalist | Admitting: Hospitalist

## 2021-10-19 DIAGNOSIS — Z87820 Personal history of traumatic brain injury: Secondary | ICD-10-CM | POA: Diagnosis not present

## 2021-10-19 DIAGNOSIS — F32A Depression, unspecified: Secondary | ICD-10-CM

## 2021-10-19 DIAGNOSIS — E7800 Pure hypercholesterolemia, unspecified: Secondary | ICD-10-CM | POA: Diagnosis present

## 2021-10-19 DIAGNOSIS — Z9884 Bariatric surgery status: Secondary | ICD-10-CM | POA: Diagnosis not present

## 2021-10-19 DIAGNOSIS — Z7902 Long term (current) use of antithrombotics/antiplatelets: Secondary | ICD-10-CM | POA: Diagnosis not present

## 2021-10-19 DIAGNOSIS — F3181 Bipolar II disorder: Secondary | ICD-10-CM | POA: Diagnosis present

## 2021-10-19 DIAGNOSIS — I639 Cerebral infarction, unspecified: Secondary | ICD-10-CM | POA: Diagnosis not present

## 2021-10-19 DIAGNOSIS — R531 Weakness: Secondary | ICD-10-CM | POA: Diagnosis not present

## 2021-10-19 DIAGNOSIS — Z8673 Personal history of transient ischemic attack (TIA), and cerebral infarction without residual deficits: Secondary | ICD-10-CM | POA: Diagnosis not present

## 2021-10-19 DIAGNOSIS — Z8659 Personal history of other mental and behavioral disorders: Secondary | ICD-10-CM

## 2021-10-19 DIAGNOSIS — Z23 Encounter for immunization: Secondary | ICD-10-CM | POA: Diagnosis present

## 2021-10-19 DIAGNOSIS — Z888 Allergy status to other drugs, medicaments and biological substances status: Secondary | ICD-10-CM | POA: Diagnosis not present

## 2021-10-19 DIAGNOSIS — N184 Chronic kidney disease, stage 4 (severe): Secondary | ICD-10-CM | POA: Diagnosis present

## 2021-10-19 DIAGNOSIS — R29704 NIHSS score 4: Secondary | ICD-10-CM | POA: Diagnosis present

## 2021-10-19 DIAGNOSIS — F4310 Post-traumatic stress disorder, unspecified: Secondary | ICD-10-CM | POA: Diagnosis present

## 2021-10-19 DIAGNOSIS — I6523 Occlusion and stenosis of bilateral carotid arteries: Secondary | ICD-10-CM | POA: Diagnosis present

## 2021-10-19 DIAGNOSIS — I10 Essential (primary) hypertension: Secondary | ICD-10-CM | POA: Diagnosis not present

## 2021-10-19 DIAGNOSIS — I63511 Cerebral infarction due to unspecified occlusion or stenosis of right middle cerebral artery: Secondary | ICD-10-CM | POA: Diagnosis present

## 2021-10-19 DIAGNOSIS — R29810 Facial weakness: Secondary | ICD-10-CM | POA: Diagnosis present

## 2021-10-19 DIAGNOSIS — Z7982 Long term (current) use of aspirin: Secondary | ICD-10-CM | POA: Diagnosis not present

## 2021-10-19 DIAGNOSIS — Z79899 Other long term (current) drug therapy: Secondary | ICD-10-CM | POA: Diagnosis not present

## 2021-10-19 DIAGNOSIS — M542 Cervicalgia: Secondary | ICD-10-CM | POA: Diagnosis present

## 2021-10-19 DIAGNOSIS — G8194 Hemiplegia, unspecified affecting left nondominant side: Secondary | ICD-10-CM | POA: Diagnosis present

## 2021-10-19 DIAGNOSIS — G8929 Other chronic pain: Secondary | ICD-10-CM | POA: Diagnosis present

## 2021-10-19 DIAGNOSIS — I129 Hypertensive chronic kidney disease with stage 1 through stage 4 chronic kidney disease, or unspecified chronic kidney disease: Secondary | ICD-10-CM | POA: Diagnosis present

## 2021-10-19 LAB — CBC
HCT: 33.7 % — ABNORMAL LOW (ref 36.0–46.0)
Hemoglobin: 10.4 g/dL — ABNORMAL LOW (ref 12.0–15.0)
MCH: 33.5 pg (ref 26.0–34.0)
MCHC: 30.9 g/dL (ref 30.0–36.0)
MCV: 108.7 fL — ABNORMAL HIGH (ref 80.0–100.0)
Platelets: 112 10*3/uL — ABNORMAL LOW (ref 150–400)
RBC: 3.1 MIL/uL — ABNORMAL LOW (ref 3.87–5.11)
RDW: 13.9 % (ref 11.5–15.5)
WBC: 4.6 10*3/uL (ref 4.0–10.5)
nRBC: 0 % (ref 0.0–0.2)

## 2021-10-19 LAB — HIV ANTIBODY (ROUTINE TESTING W REFLEX): HIV Screen 4th Generation wRfx: NONREACTIVE

## 2021-10-19 LAB — BASIC METABOLIC PANEL
Anion gap: 10 (ref 5–15)
BUN: 55 mg/dL — ABNORMAL HIGH (ref 8–23)
CO2: 17 mmol/L — ABNORMAL LOW (ref 22–32)
Calcium: 8.5 mg/dL — ABNORMAL LOW (ref 8.9–10.3)
Chloride: 111 mmol/L (ref 98–111)
Creatinine, Ser: 1.97 mg/dL — ABNORMAL HIGH (ref 0.44–1.00)
GFR, Estimated: 27 mL/min — ABNORMAL LOW (ref >60)
Glucose, Bld: 103 mg/dL — ABNORMAL HIGH (ref 70–99)
Potassium: 4.4 mmol/L (ref 3.5–5.1)
Sodium: 138 mmol/L (ref 135–145)

## 2021-10-19 LAB — MAGNESIUM: Magnesium: 2.3 mg/dL (ref 1.7–2.4)

## 2021-10-19 MED ORDER — PERFLUTREN LIPID MICROSPHERE
1.0000 mL | INTRAVENOUS | Status: AC | PRN
  Administered 2021-10-19: 4.5 mL via INTRAVENOUS
  Filled 2021-10-19: qty 10

## 2021-10-19 NOTE — Assessment & Plan Note (Signed)
--  stable ?--cont home Lamictal ?
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
## 2021-10-19 NOTE — TOC Progression Note (Signed)
Transition of Care (TOC) - Progression Note  ? ? ?Patient Details  ?Name: Kim Walton ?MRN: [id number] ?Date of Birth: February 01, 1956 ? ?Transition of Care (TOC) CM/SW Contact  ?Izola Price, RN ?Phone Number: ?10/19/2021, 12:38 PM ? ?Clinical Narrative:  10/19/21: Contacted CIR re PT referral and Clemens Catholic has just reviewed chart and patient seems appropriate. Waiting on MRI and admit to inpatient. Simmie Davies RN CM   ? ? ? ?  ?  ? ?Expected Discharge Plan and Services ?  ?  ?  ?  ?  ?                ?  ?  ?  ?  ?  ?  ?  ?  ?  ?  ? ? ?Social Determinants of Health (SDOH) Interventions ?  ? ?Readmission Risk Interventions ?No flowsheet data found. ? ?

## 2021-10-19 NOTE — Assessment & Plan Note (Addendum)
--  hold home amlodipine, atenolol and Lisinopril since BP has been wnl ?

## 2021-10-19 NOTE — Assessment & Plan Note (Signed)
--  Cr 1.95 on presentation.  Last Cr on record 1.55 back in 2018, so likely progressed to CKD 4. ?
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
## 2021-10-19 NOTE — Progress Notes (Signed)
?  Progress Note ? ? ?Patient: Kim Walton OAC:[id number] DOB: [birth year] DOA: 10/18/2021     0 ?DOS: the patient was seen and examined on 10/19/2021 ?  ?Brief hospital course: ?No notes on file ? ?Assessment and Plan: ?* Stroke (cerebrum) ([place]) ?Multifocal acute ischemia within the right MCA territory ?Hx of prior stroke ?--presented with left-sided weakness ?Plan: ?--MRA head and neck ?--Echo ?--neuro consult ? ? ?CKD (chronic kidney disease) stage 4, GFR 15-29 ml/min (HCC) ?--Cr 1.95 on presentation.  Last Cr on record 1.55 back in 2018, so likely progressed to CKD 4. ? ?Depression ?--stable ?--cont home Zoloft ? ?History of bipolar disorder ?--stable ?--cont home Lamictal ? ?HTN (hypertension) ?--hold home amlodipine, atenolol and Lisinopril for permissive HTN until stroke ruled out ? ? ? ? ?  ? ?Subjective:  ?MRI brain returned pos for acute stroke.  PT rec CIR. ? ? ?Physical Exam: ?Constitutional: NAD, AAOx3, sitting in recliner ?HEENT: conjunctivae and lids normal, EOMI ?CV: No cyanosis.   ?RESP: normal respiratory effort, on RA ?SKIN: warm, dry ?Psych: Normal mood and affect.  Appropriate judgement and reason ? ? ?Data Reviewed: ? ?Family Communication:  ? ?Disposition: ?Status is: Inpatient ?Remains inpatient appropriate because: pending stroke workup and neuro consult ? ? Planned Discharge Destination:  CIR ? ? ? ?Time spent: 50 minutes ? ?Author: ?Enzo Bi, MD ?10/19/2021 4:07 PM ? ?For on call review [email or web address].  ? ?

## 2021-10-19 NOTE — Progress Notes (Signed)
Inpatient Rehab Admissions Coordinator:  ? ?Per therapy recommendations,  patient was screened for CIR candidacy by Thresia Ramanathan, MS, CCC-SLP. At this time, Pt. Appears to be a a potential candidate for CIR. I will place   order for rehab consult per protocol for full assessment. Please contact me any with questions. ? ?Smith Potenza, MS, CCC-SLP ?Rehab Admissions Coordinator  ?[phone] (celll) ?[phone] (office) ? ?
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
## 2021-10-19 NOTE — Assessment & Plan Note (Signed)
--  stable ?--cont home Zoloft ?

## 2021-10-19 NOTE — Assessment & Plan Note (Addendum)
Multifocal acute ischemia within the right MCA territory ?Hx of prior stroke ?--presented with left-sided weakness ?Plan: ?- ASA 81mg  daily + plavix 75mg  daily x90 days f/b?plavix 75mg ?daily monotherapy after that?(90 days in setting of severe intracranial stenosis) ?- Patient is now 48 hrs out from sx onset, therefore goal is normotension. Avoid hypotension, given that she has bilateral supraclinoid ICA stenosis ?- No indication for statin; pt is at goal without ?- Tele ?--outpatient neurology f/u ?

## 2021-10-19 NOTE — Progress Notes (Signed)
SLP Cancellation Note ? ?Patient Details ?Name: Kim Walton ?MRN: [id number] ?DOB: 1955/08/12 ? ? ?Cancelled treatment:       Reason Eval/Treat Not Completed: SLP screened, no needs identified, will sign off. Pt reports no change in baseline wordfinding difficulty s/p prior CVA, TBI. Orders cancelled per MD. ? ?Deneise Lever, MS, CCC-SLP ?Speech-Language Pathologist ?[phone] ? ?Aliene Altes ?10/19/2021, 12:50 PM ? ? ?

## 2021-10-19 NOTE — Progress Notes (Signed)
*  PRELIMINARY RESULTS* ?Echocardiogram ?2D Echocardiogram has been performed. Definity IV imaging agent used on this study. ? ?Kim Walton ?10/19/2021, 4:29 PM ?

## 2021-10-20 DIAGNOSIS — I639 Cerebral infarction, unspecified: Secondary | ICD-10-CM

## 2021-10-20 DIAGNOSIS — R531 Weakness: Secondary | ICD-10-CM | POA: Diagnosis not present

## 2021-10-20 DIAGNOSIS — I10 Essential (primary) hypertension: Secondary | ICD-10-CM | POA: Diagnosis not present

## 2021-10-20 LAB — BASIC METABOLIC PANEL WITH GFR
Anion gap: 8 (ref 5–15)
BUN: 50 mg/dL — ABNORMAL HIGH (ref 8–23)
CO2: 16 mmol/L — ABNORMAL LOW (ref 22–32)
Calcium: 8.7 mg/dL — ABNORMAL LOW (ref 8.9–10.3)
Chloride: 113 mmol/L — ABNORMAL HIGH (ref 98–111)
Creatinine, Ser: 1.93 mg/dL — ABNORMAL HIGH (ref 0.44–1.00)
GFR, Estimated: 28 mL/min — ABNORMAL LOW
Glucose, Bld: 118 mg/dL — ABNORMAL HIGH (ref 70–99)
Potassium: 4.2 mmol/L (ref 3.5–5.1)
Sodium: 137 mmol/L (ref 135–145)

## 2021-10-20 LAB — CBC
HCT: 32.7 % — ABNORMAL LOW (ref 36.0–46.0)
Hemoglobin: 10.1 g/dL — ABNORMAL LOW (ref 12.0–15.0)
MCH: 33.2 pg (ref 26.0–34.0)
MCHC: 30.9 g/dL (ref 30.0–36.0)
MCV: 107.6 fL — ABNORMAL HIGH (ref 80.0–100.0)
Platelets: 111 10*3/uL — ABNORMAL LOW (ref 150–400)
RBC: 3.04 MIL/uL — ABNORMAL LOW (ref 3.87–5.11)
RDW: 13.6 % (ref 11.5–15.5)
WBC: 5.9 10*3/uL (ref 4.0–10.5)
nRBC: 0 % (ref 0.0–0.2)

## 2021-10-20 LAB — ECHOCARDIOGRAM COMPLETE
AR max vel: 2.07 cm2
AV Peak grad: 8.8 mmHg
Ao pk vel: 1.48 m/s
Area-P 1/2: 2.78 cm2
Calc EF: 60.6 %
Height: 66 in
S' Lateral: 2.7 cm
Single Plane A2C EF: 60.6 %
Single Plane A4C EF: 64 %
Weight: 2240 [oz_av]

## 2021-10-20 LAB — MAGNESIUM: Magnesium: 2.3 mg/dL (ref 1.7–2.4)

## 2021-10-20 MED ORDER — ASPIRIN EC 81 MG PO TBEC
81.0000 mg | DELAYED_RELEASE_TABLET | Freq: Every day | ORAL | Status: DC
  Administered 2021-10-20 – 2021-10-21 (×2): 81 mg via ORAL
  Filled 2021-10-20 (×2): qty 1

## 2021-10-20 NOTE — Plan of Care (Signed)
?  Problem: Education: ?Goal: Knowledge of General Education information will improve ?Description: Including pain rating scale, medication(s)/side effects and non-pharmacologic comfort measures ?10/20/2021 1101 by Oris Drone, RN ?Outcome: Progressing ?10/20/2021 1101 by Oris Drone, RN ?Outcome: Progressing ?  ?Problem: Health Behavior/Discharge Planning: ?Goal: Ability to manage health-related needs will improve ?10/20/2021 1101 by Oris Drone, RN ?Outcome: Progressing ?10/20/2021 1101 by Oris Drone, RN ?Outcome: Progressing ?  ?Problem: Clinical Measurements: ?Goal: Ability to maintain clinical measurements within normal limits will improve ?10/20/2021 1101 by Oris Drone, RN ?Outcome: Progressing ?10/20/2021 1101 by Oris Drone, RN ?Outcome: Progressing ?Goal: Will remain free from infection ?10/20/2021 1101 by Oris Drone, RN ?Outcome: Progressing ?10/20/2021 1101 by Oris Drone, RN ?Outcome: Progressing ?Goal: Diagnostic test results will improve ?10/20/2021 1101 by Oris Drone, RN ?Outcome: Progressing ?10/20/2021 1101 by Oris Drone, RN ?Outcome: Progressing ?Goal: Respiratory complications will improve ?10/20/2021 1101 by Oris Drone, RN ?Outcome: Progressing ?10/20/2021 1101 by Oris Drone, RN ?Outcome: Progressing ?Goal: Cardiovascular complication will be avoided ?10/20/2021 1101 by Oris Drone, RN ?Outcome: Progressing ?10/20/2021 1101 by Oris Drone, RN ?Outcome: Progressing ?  ?Problem: Activity: ?Goal: Risk for activity intolerance will decrease ?10/20/2021 1101 by Oris Drone, RN ?Outcome: Progressing ?10/20/2021 1101 by Oris Drone, RN ?Outcome: Progressing ?  ?Problem: Nutrition: ?Goal: Adequate nutrition will be maintained ?10/20/2021 1101 by Oris Drone, RN ?Outcome: Progressing ?10/20/2021 1101 by Oris Drone, RN ?Outcome: Progressing ?  ?Problem: Coping: ?Goal: Level of anxiety will  decrease ?10/20/2021 1101 by Oris Drone, RN ?Outcome: Progressing ?10/20/2021 1101 by Oris Drone, RN ?Outcome: Progressing ?  ?Problem: Elimination: ?Goal: Will not experience complications related to bowel motility ?10/20/2021 1101 by Oris Drone, RN ?Outcome: Progressing ?10/20/2021 1101 by Oris Drone, RN ?Outcome: Progressing ?Goal: Will not experience complications related to urinary retention ?10/20/2021 1101 by Oris Drone, RN ?Outcome: Progressing ?10/20/2021 1101 by Oris Drone, RN ?Outcome: Progressing ?  ?Problem: Pain Managment: ?Goal: General experience of comfort will improve ?10/20/2021 1101 by Oris Drone, RN ?Outcome: Progressing ?10/20/2021 1101 by Oris Drone, RN ?Outcome: Progressing ?  ?Problem: Safety: ?Goal: Ability to remain free from injury will improve ?10/20/2021 1101 by Oris Drone, RN ?Outcome: Progressing ?10/20/2021 1101 by Oris Drone, RN ?Outcome: Progressing ?  ?Problem: Skin Integrity: ?Goal: Risk for impaired skin integrity will decrease ?10/20/2021 1101 by Oris Drone, RN ?Outcome: Progressing ?10/20/2021 1101 by Oris Drone, RN ?Outcome: Progressing ?  ?Problem: Education: ?Goal: Knowledge of disease or condition will improve ?10/20/2021 1101 by Oris Drone, RN ?Outcome: Progressing ?10/20/2021 1101 by Oris Drone, RN ?Outcome: Progressing ?  ?

## 2021-10-20 NOTE — Progress Notes (Signed)
Inpatient Rehab Admissions Coordinator:  ? ?I spoke with pt. And interdisciplinary team regarding potential CIR. Admit. Pt. States that she prefers to go home rather than stay in the hospital and wait for  insurance to approve CIR ( which is not guaranteed).. She states that her home is accessible via walker and she has used one before following her hip replacement. CIR will sign off.  ? ?Clemens Catholic, MS, CCC-SLP ?Rehab Admissions Coordinator  ?[phone] (celll) ?[phone] (office) ? ?
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
## 2021-10-21 LAB — CBC
HCT: 31.8 % — ABNORMAL LOW (ref 36.0–46.0)
Hemoglobin: 9.8 g/dL — ABNORMAL LOW (ref 12.0–15.0)
MCH: 33.3 pg (ref 26.0–34.0)
MCHC: 30.8 g/dL (ref 30.0–36.0)
MCV: 108.2 fL — ABNORMAL HIGH (ref 80.0–100.0)
Platelets: 95 10*3/uL — ABNORMAL LOW (ref 150–400)
RBC: 2.94 MIL/uL — ABNORMAL LOW (ref 3.87–5.11)
RDW: 13.6 % (ref 11.5–15.5)
WBC: 4.7 10*3/uL (ref 4.0–10.5)
nRBC: 0 % (ref 0.0–0.2)

## 2021-10-21 LAB — BASIC METABOLIC PANEL
Anion gap: 8 (ref 5–15)
BUN: 46 mg/dL — ABNORMAL HIGH (ref 8–23)
CO2: 17 mmol/L — ABNORMAL LOW (ref 22–32)
Calcium: 8.6 mg/dL — ABNORMAL LOW (ref 8.9–10.3)
Chloride: 114 mmol/L — ABNORMAL HIGH (ref 98–111)
Creatinine, Ser: 2.02 mg/dL — ABNORMAL HIGH (ref 0.44–1.00)
GFR, Estimated: 27 mL/min — ABNORMAL LOW (ref >60)
Glucose, Bld: 143 mg/dL — ABNORMAL HIGH (ref 70–99)
Potassium: 4.6 mmol/L (ref 3.5–5.1)
Sodium: 139 mmol/L (ref 135–145)

## 2021-10-21 LAB — MAGNESIUM: Magnesium: 2.3 mg/dL (ref 1.7–2.4)

## 2021-10-21 MED ORDER — ASPIRIN 81 MG PO CHEW: 81.0000 mg | CHEWABLE_TABLET | Freq: Every day | ORAL | Status: AC
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
## 2021-10-21 NOTE — Progress Notes (Signed)
Inpatient Rehab Admissions Coordinator:  ? ? PT saw Pt. This AM  and is now recommending home with [facility]. Pt is in agreement to d/c home now, so I will not pursue for CIR admit. CIR will sign off.  ? ?Clemens Catholic, MS, CCC-SLP ?Rehab Admissions Coordinator  ?[phone] (celll) ?[phone] (office) ? ?

## 2021-10-21 NOTE — Progress Notes (Signed)
Discharge instructions reviewed with patient including followup visits (with PCP and neurology) and medications changes.  Understanding was verbalized and all questions were answered.  IV removed without complication; patient tolerated well.  Patient discharged home via wheelchair in stable condition escorted by nursing staff. ? ?

## 2021-10-21 NOTE — TOC Initial Note (Signed)
Transition of Care (TOC) - Initial/Assessment Note  ? ? ?Patient Details  ?Name: Kim Walton ?MRN: [id number] ?Date of Birth: Sep 19, 1955 ? ?Transition of Care (TOC) CM/SW Contact:    ?Pete Pelt, RN ?Phone Number: ?10/21/2021, 10:33 AM ? ?Clinical Narrative:    Patient lives at home alone.  She states that she has a friend she can call for a ride home and occasional help, but likes being independent.   ? ?Patient drives herself to appointments, and has no medication concerns          ?  ?[facility] home health notified, [facility] notified of rolling walker  Patient has not further concerns.  She states she feels safe and confortable with dc home ? ?Expected Discharge Plan: [place] ?Barriers to Discharge: Barriers Resolved ? ? ?Patient Goals and CMS Choice ?  ?  ?  ? ?Expected Discharge Plan and Services ?Expected Discharge Plan: [place] ?  ?Discharge Planning Services: CM Consult ?Post Acute Care Choice: Home Health ?Living arrangements for the past 2 months: [place] ?Expected Discharge Date: 10/21/21               ?  ?  ?  ?  ?  ?  ?  ?  ?  ?  ? ?Prior Living Arrangements/Services ?Living arrangements for the past 2 months: [place] ?Lives with:: Self ?Patient language and need for interpreter reviewed:: Yes (No interpreter required) ?       ?Need for Family Participation in Patient Care: Yes (Comment) (see notes) ?Care giver support system in place?: No (comment) ?  ?Criminal Activity/Legal Involvement Pertinent to Current Situation/Hospitalization: No - Comment as needed ? ?Activities of Daily Living ?Home Assistive Devices/Equipment: Grab bars around toilet, Grab bars in shower, Cane (specify quad or straight), Walker (specify type), Dentures (specify type), Eyeglasses, Bedside commode/3-in-1 ?ADL Screening (condition at time of admission) ?Patient's cognitive ability adequate to safely complete daily activities?: Yes ?Is the patient deaf or have  difficulty hearing?: No ?Does the patient have difficulty seeing, even when wearing glasses/contacts?: No ?Does the patient have difficulty concentrating, remembering, or making decisions?: No ?Patient able to express need for assistance with ADLs?: Yes ?Does the patient have difficulty dressing or bathing?: No ?Independently performs ADLs?: Yes (appropriate for developmental age) ?Does the patient have difficulty walking or climbing stairs?: No ?Weakness of Legs: None ?Weakness of Arms/Hands: None ? ?Permission Sought/Granted ?Permission sought to share information with : Case Manager ?Permission granted to share information with : Yes, Verbal Permission Granted ?   ? Permission granted to share info w AGENCY: home health, DME agencies if recommended ?   ?   ? ?Emotional Assessment ?Appearance:: Appears stated age ?Attitude/Demeanor/Rapport: Gracious, Engaged ?Affect (typically observed): Pleasant, Appropriate ?Orientation: : Oriented to Self, Oriented to Place, Oriented to  Time, Oriented to Situation ?Alcohol / Substance Use: Not Applicable ?Psych Involvement: No (comment) ? ?Admission diagnosis:  Left-sided weakness [R53.1] ?Cerebrovascular accident (CVA), unspecified mechanism ([place]) [I63.9] ?Stroke (cerebrum) ([place]) [I63.9] ?Patient Active Problem List  ? Diagnosis Date Noted  ? Stroke (cerebrum) ([place]) 10/19/2021  ? HTN (hypertension) 10/19/2021  ? History of bipolar disorder 10/19/2021  ? Depression 10/19/2021  ? CKD (chronic kidney disease) stage 4, GFR 15-29 ml/min (HCC) 10/19/2021  ? Left-sided weakness 10/18/2021  ? Acute encephalopathy 12/23/2016  ? CVA (cerebral vascular accident) ([place]) 12/23/2016  ? Altered mental state 01/11/2015  ? ?PCP:  Tera Helper, MD ?Pharmacy:   ?  [id number] - [place], [address] ?[place] [address] ?Phone: [phone] Fax: [phone] ? ? ? ? ?Social Determinants of Health (SDOH) Interventions ?  ? ?Readmission Risk Interventions ?No flowsheet  data found. ? ? ?

## 2021-10-21 NOTE — Progress Notes (Signed)
Inpatient Rehab Admissions Coordinator:  ? ?Per acute MD, Pt. Is now interested in CIR. I will put case through to insurance today and pursue for admit  pending insurance auth. ? ?Clemens Catholic, MS, CCC-SLP ?Rehab Admissions Coordinator  ?[phone] (celll) ?[phone] (office) ? ?
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
## 2021-10-24 ENCOUNTER — Other Ambulatory Visit: Payer: Self-pay | Admitting: Neurology

## 2021-10-24 DIAGNOSIS — H833X3 Noise effects on inner ear, bilateral: Secondary | ICD-10-CM

## 2021-11-07 ENCOUNTER — Ambulatory Visit
Admission: RE | Admit: 2021-11-07 | Discharge: 2021-11-07 | Disposition: A | Discharge status: Home / Self Care | Payer: Medicare HMO | Source: Ambulatory Visit | Attending: Neurology | Admitting: Neurology

## 2021-11-07 DIAGNOSIS — H833X3 Noise effects on inner ear, bilateral: Secondary | ICD-10-CM
